# Patient Record
Sex: Female | Born: 1950 | Race: White | Hispanic: No | Smoking: Current every day smoker
Health system: Southern US, Community
[De-identification: ages and names within clinical notes are randomized; demographics above are authoritative.]

## PROBLEM LIST (undated history)

## (undated) DIAGNOSIS — J40 Bronchitis, not specified as acute or chronic: Secondary | ICD-10-CM

## (undated) DIAGNOSIS — J449 Chronic obstructive pulmonary disease, unspecified: Secondary | ICD-10-CM

## (undated) DIAGNOSIS — J439 Emphysema, unspecified: Secondary | ICD-10-CM

## (undated) DIAGNOSIS — E785 Hyperlipidemia, unspecified: Secondary | ICD-10-CM

## (undated) DIAGNOSIS — R609 Edema, unspecified: Secondary | ICD-10-CM

## (undated) DIAGNOSIS — J45909 Unspecified asthma, uncomplicated: Secondary | ICD-10-CM

## (undated) DIAGNOSIS — M1990 Unspecified osteoarthritis, unspecified site: Secondary | ICD-10-CM

## (undated) DIAGNOSIS — E559 Vitamin D deficiency, unspecified: Secondary | ICD-10-CM

## (undated) DIAGNOSIS — R05 Cough: Secondary | ICD-10-CM

## (undated) DIAGNOSIS — R7303 Prediabetes: Secondary | ICD-10-CM

## (undated) DIAGNOSIS — K635 Polyp of colon: Secondary | ICD-10-CM

## (undated) DIAGNOSIS — I10 Essential (primary) hypertension: Secondary | ICD-10-CM

## (undated) DIAGNOSIS — G250 Essential tremor: Secondary | ICD-10-CM

## (undated) DIAGNOSIS — F419 Anxiety disorder, unspecified: Secondary | ICD-10-CM

## (undated) DIAGNOSIS — F32A Depression, unspecified: Secondary | ICD-10-CM

## (undated) DIAGNOSIS — R059 Cough, unspecified: Secondary | ICD-10-CM

## (undated) HISTORY — DX: Cough: R05

## (undated) HISTORY — DX: Anxiety disorder, unspecified: F41.9

## (undated) HISTORY — DX: Unspecified asthma, uncomplicated: J45.909

## (undated) HISTORY — PX: TOTAL ABDOMINAL HYSTERECTOMY: SHX209

## (undated) HISTORY — DX: Chronic obstructive pulmonary disease, unspecified: J44.9

## (undated) HISTORY — DX: Polyp of colon: K63.5

## (undated) HISTORY — PX: EYE SURGERY: SHX253

## (undated) HISTORY — DX: Essential (primary) hypertension: I10

## (undated) HISTORY — DX: Vitamin D deficiency, unspecified: E55.9

## (undated) HISTORY — DX: Unspecified osteoarthritis, unspecified site: M19.90

## (undated) HISTORY — DX: Hyperlipidemia, unspecified: E78.5

## (undated) HISTORY — DX: Bronchitis, not specified as acute or chronic: J40

## (undated) HISTORY — PX: CATARACT EXTRACTION, BILATERAL: SHX1313

## (undated) HISTORY — PX: OVARY SURGERY: SHX727

## (undated) HISTORY — DX: Edema, unspecified: R60.9

## (undated) HISTORY — PX: APPENDECTOMY: SHX54

## (undated) HISTORY — DX: Depression, unspecified: F32.A

## (undated) HISTORY — DX: Emphysema, unspecified: J43.9

## (undated) HISTORY — PX: VAGINAL HYSTERECTOMY: SUR661

## (undated) HISTORY — DX: Cough, unspecified: R05.9

## (undated) HISTORY — DX: Essential tremor: G25.0

---

## 1976-01-05 HISTORY — PX: EXPLORATORY LAPAROTOMY: SUR591

## 2001-10-27 ENCOUNTER — Emergency Department (HOSPITAL_COMMUNITY): Admission: EM | Admit: 2001-10-27 | Discharge: 2001-10-27 | Payer: Self-pay | Admitting: Emergency Medicine

## 2001-11-01 ENCOUNTER — Emergency Department (HOSPITAL_COMMUNITY): Admission: EM | Admit: 2001-11-01 | Discharge: 2001-11-01 | Payer: Self-pay | Admitting: Emergency Medicine

## 2003-05-22 ENCOUNTER — Emergency Department (HOSPITAL_COMMUNITY): Admission: EM | Admit: 2003-05-22 | Discharge: 2003-05-23 | Payer: Self-pay | Admitting: Emergency Medicine

## 2013-01-23 ENCOUNTER — Ambulatory Visit (INDEPENDENT_AMBULATORY_CARE_PROVIDER_SITE_OTHER): Payer: BC Managed Care – PPO | Admitting: Podiatrist

## 2013-01-23 ENCOUNTER — Encounter: Payer: Self-pay | Admitting: Podiatrist

## 2013-01-23 ENCOUNTER — Ambulatory Visit (INDEPENDENT_AMBULATORY_CARE_PROVIDER_SITE_OTHER): Payer: BC Managed Care – PPO

## 2013-01-23 VITALS — BP 147/90 | HR 67 | Resp 18

## 2013-01-23 DIAGNOSIS — M79609 Pain in unspecified limb: Secondary | ICD-10-CM

## 2013-01-23 DIAGNOSIS — M722 Plantar fascial fibromatosis: Secondary | ICD-10-CM

## 2013-01-23 MED ORDER — TRIAMCINOLONE ACETONIDE 40 MG/ML IJ SUSP
20.0000 mg | Freq: Once | INTRAMUSCULAR | Status: AC
  Administered 2013-01-23: 20 mg

## 2013-01-23 MED ORDER — MELOXICAM 15 MG PO TABS: 15.0000 mg | ORAL_TABLET | Freq: Every day | ORAL | Status: DC

## 2013-01-23 NOTE — Progress Notes (Signed)
   Subjective:    Patient ID: Jane Graves, female    DOB: 1950-07-17, 63 y.o.   MRN: [id number]  HPI my left heel hurts on the bottom and been going on for about 2 months and burns and throbs and hurts standing all night at work and sore and tender  Patient presents today for left heel pain. She relates pain with first step after sitting for long periods of time and relates that it improves when she walks around a little bit. She's tried shoe changes and has multiple pairs of shoes that she wears at work.  Review of Systems  Constitutional: Negative.   HENT: Negative.   Eyes: Negative.   Respiratory: Negative.   Cardiovascular: Negative.   Gastrointestinal: Negative.   Endocrine: Negative.   Genitourinary: Negative.   Musculoskeletal: Negative.   Skin: Negative.   Allergic/Immunologic: Negative.   Neurological: Negative.   Hematological: Negative.   Psychiatric/Behavioral: Negative.        Objective:   Physical Exam GENERAL APPEARANCE: Alert, conversant. Appropriately groomed. No acute distress.  VASCULAR: Pedal pulses palpable and strong bilateral.  Capillary refill time is immediate to all digits,  Proximal to distal cooling it warm to warm.  Digital hair growth is present bilateral  NEUROLOGIC: sensation is intact epicritically and protectively to 5.07 monofilament at 5/5 sites bilateral.  Light touch is intact bilateral, vibratory sensation intact bilateral, achilles tendon reflex is intact bilateral.  MUSCULOSKELETAL: pain on palpation plantar medial heel left foot noted.  Rectus foot type is seen.  Discomfort upon standing after sitting for long periods of time noted.  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  She has pain when walking as well.   DERMATOLOGIC: skin color, texture, and turger are within normal limits.  No preulcerative lesions are seen, no interdigital maceration noted.  No open lesions present.  Digital nails are asymptomatic.   xrays are  normal-- no spurring noted, no sign of fracture    Assessment & Plan:  Plantar fasciitis left  Plan: Injection of Kenalog and Marcaine mixture was infiltrated into the left heel under sterile technique. A plantar fascial strapping was applied and she was given instructions for stretching and shoe gear changes. She'll be seen back as needed for followup and if any problems arise she will call.

## 2013-01-23 NOTE — Patient Instructions (Signed)
Wear a good fitting shoe-  Running shoe brands I recommend are Brooks, New Balance and [employer]iics.  Always have a shoe fit specialist help you choose your shoes as there are many "varieties" of shoes and they can find you the best fit.  [employer]/ [employer] ([place], [place], [place]), [employer], [employer] ([place]) have trained staff to help you in this process.  The [employer] in [place] has a great selection of euro-comfort casual shoes with good comfort and support as well.  Avoid prolonged use of thin ballet type flats, or flip flops.  Everyday use of these shoes can actually cause foot problems or injuries.    Plantar Fasciitis (Heel Spur Syndrome) with Rehab The plantar fascia is a fibrous, ligament-like, soft-tissue structure that spans the bottom of the foot. Plantar fasciitis is a condition that causes pain in the foot due to inflammation of the tissue. SYMPTOMS   Pain and tenderness on the underneath side of the foot.  Pain that worsens with standing or walking. CAUSES  Plantar fasciitis is caused by irritation and injury to the plantar fascia on the underneath side of the foot. Common mechanisms of injury include:  Direct trauma to bottom of the foot.  Damage to a small nerve that runs under the foot where the main fascia attaches to the heel bone. Stress placed on the plantar fascia due to any mild increased activity or injury RISK INCREASES WITH:   Obesity.  Poor strength and flexibility.  Improperly fitted shoes.  Tight calf muscles.  Flat feet.  Failure to warm-up properly before activity.  PREVENTION  Warm up and stretch properly before activity.  Strength, flexibility  Maintain a health body weight.  Avoid stress on the plantar fascia.  Wear properly fitted shoes, including arch supports for individuals who have flat feet. PROGNOSIS  If treated properly, then the symptoms of plantar fasciitis usually resolve without  surgery. However, occasionally surgery is necessary. RELATED COMPLICATIONS   Recurrent symptoms that may result in a chronic condition.  Problems of the lower back that are caused by compensating for the injury, such as limping.  Pain or weakness of the foot during push-off following surgery.  Chronic inflammation, scarring, and partial or complete fascia tear, occurring more often from repeated injections. TREATMENT  Treatment initially involves the use of ice and medication to help reduce pain and inflammation. The use of strengthening and stretching exercises may help reduce pain with activity, especially stretches of the Achilles tendon.  Your caregiver may recommend that you use arch supports to help reduce stress on the plantar fascia. Often, corticosteroid injections are given to reduce inflammation. If symptoms persist for greater than 6 months despite non-surgical (conservative), then surgery may be recommended.  MEDICATION   If pain medication is necessary, then nonsteroidal anti-inflammatory medications, such as aspirin and ibuprofen, or other minor pain relievers, such as acetaminophen, are often recommended. Corticosteroid injections may be given by your caregiver.  HEAT AND COLD  Cold treatment (icing) relieves pain and reduces inflammation. Cold treatment should be applied for 10 to 15 minutes every 2 to 3 hours for inflammation and pain and immediately after any activity that aggravates your symptoms. Use ice packs or massage the area with a piece of ice (ice massage).  Heat treatment may be used prior to performing the stretching and strengthening activities prescribed by your caregiver, physical therapist, or athletic trainer. Use a heat pack or soak the injury in warm water. SEEK IMMEDIATE MEDICAL CARE   IF:  Treatment seems to offer no benefit, or the condition worsens.  Any medications produce adverse side effects.    EXERCISES-- perform each exercise a total of 10-15  repetitions.  Hold for 30 seconds and perform 3 times per day   RANGE OF MOTION (ROM) AND STRETCHING EXERCISES - Plantar Fasciitis (Heel Spur Syndrome) These exercises may help you when beginning to rehabilitate your injury.   While completing these exercises, remember:   Restoring tissue flexibility helps normal motion to return to the joints. This allows healthier, less painful movement and activity.  An effective stretch should be held for at least 30 seconds.  A stretch should never be painful. You should only feel a gentle lengthening or release in the stretched tissue. RANGE OF MOTION - Toe Extension, Flexion  Sit with your right / left leg crossed over your opposite knee.  Grasp your toes and gently pull them back toward the top of your foot. You should feel a stretch on the bottom of your toes and/or foot.  Hold this stretch for __________ seconds.  Now, gently pull your toes toward the bottom of your foot. You should feel a stretch on the top of your toes and or foot.  Hold this stretch for __________ seconds. Repeat __________ times. Complete this stretch __________ times per day.  RANGE OF MOTION - Ankle Dorsiflexion, Active Assisted  Remove shoes and sit on a chair that is preferably not on a carpeted surface.  Place right / left foot under knee. Extend your opposite leg for support.  Keeping your heel down, slide your right / left foot back toward the chair until you feel a stretch at your ankle or calf. If you do not feel a stretch, slide your bottom forward to the edge of the chair, while still keeping your heel down.  Hold this stretch for __________ seconds. Repeat __________ times. Complete this stretch __________ times per day.  STRETCH  Gastroc, Standing  Place hands on wall.  Extend right / left leg, keeping the front knee somewhat bent.  Slightly point your toes inward on your back foot.  Keeping your right / left heel on the floor and your knee  straight, shift your weight toward the wall, not allowing your back to arch.  You should feel a gentle stretch in the right / left calf. Hold this position for __________ seconds. Repeat __________ times. Complete this stretch __________ times per day. STRETCH  Soleus, Standing  Place hands on wall.  Extend right / left leg, keeping the other knee somewhat bent.  Slightly point your toes inward on your back foot.  Keep your right / left heel on the floor, bend your back knee, and slightly shift your weight over the back leg so that you feel a gentle stretch deep in your back calf.  Hold this position for __________ seconds. Repeat __________ times. Complete this stretch __________ times per day. STRETCH  Gastrocsoleus, Standing  Note: This exercise can place a lot of stress on your foot and ankle. Please complete this exercise only if specifically instructed by your caregiver.   Place the ball of your right / left foot on a step, keeping your other foot firmly on the same step.  Hold on to the wall or a rail for balance.  Slowly lift your other foot, allowing your body weight to press your heel down over the edge of the step.  You should feel a stretch in your right / left calf.    Hold this position for __________ seconds.  Repeat this exercise with a slight bend in your right / left knee. Repeat __________ times. Complete this stretch __________ times per day.  STRENGTHENING EXERCISES - Plantar Fasciitis (Heel Spur Syndrome)  These exercises may help you when beginning to rehabilitate your injury. They may resolve your symptoms with or without further involvement from your physician, physical therapist or athletic trainer. While completing these exercises, remember:   Muscles can gain both the endurance and the strength needed for everyday activities through controlled exercises.  Complete these exercises as instructed by your physician, physical therapist or athletic trainer.  Progress the resistance and repetitions only as guided.   

## 2013-06-13 LAB — HM COLONOSCOPY

## 2020-10-20 ENCOUNTER — Encounter: Payer: Self-pay | Admitting: Cardiology

## 2020-10-29 ENCOUNTER — Other Ambulatory Visit: Payer: Self-pay

## 2020-10-29 ENCOUNTER — Encounter: Payer: Self-pay | Admitting: Cardiology

## 2020-10-29 ENCOUNTER — Ambulatory Visit: Payer: Medicare Other | Admitting: Cardiology

## 2020-10-29 DIAGNOSIS — R0789 Other chest pain: Secondary | ICD-10-CM | POA: Diagnosis not present

## 2020-10-29 DIAGNOSIS — F17200 Nicotine dependence, unspecified, uncomplicated: Secondary | ICD-10-CM | POA: Diagnosis not present

## 2020-10-29 DIAGNOSIS — I10 Essential (primary) hypertension: Secondary | ICD-10-CM | POA: Insufficient documentation

## 2020-10-29 DIAGNOSIS — R0609 Other forms of dyspnea: Secondary | ICD-10-CM

## 2020-10-29 DIAGNOSIS — E785 Hyperlipidemia, unspecified: Secondary | ICD-10-CM | POA: Diagnosis not present

## 2020-10-29 DIAGNOSIS — Z720 Tobacco use: Secondary | ICD-10-CM

## 2020-10-29 HISTORY — DX: Tobacco use: Z72.0

## 2020-10-29 HISTORY — DX: Other chest pain: R07.89

## 2020-10-29 HISTORY — DX: Other forms of dyspnea: R06.09

## 2020-10-29 MED ORDER — METOPROLOL TARTRATE 100 MG PO TABS: 100.0000 mg | ORAL_TABLET | Freq: Once | ORAL | 0 refills | Status: DC

## 2020-10-29 NOTE — Progress Notes (Signed)
Cardiology Consultation:    Date:  10/29/2020   ID:  Jane Graves, DOB 08-12-1950, MRN [id number]  PCP:  Jerrye Bushy, FNP  Cardiologist:  Gypsy Balsam, MD   Referring MD: Jerrye Bushy, FNP   Chief Complaint  Patient presents with   Chest Pain   night sweats and nausea     Ongoing for about a month    History of Present Illness:    Jane Graves is a 70 y.o. female who is being seen today for the evaluation of atypical chest pain at the request of Jerrye Bushy, FNP.  About month and a half ago at evening time she developed tightness in the lower portion of her chest that tightness gradually migrated towards the front she became nauseated and also sweating profoundly then everything subsided at that time she make a decision that she need to see cardiologist.  Past medical history is also significant for essential hypertension, dyslipidemia, chronic smoking which is still ongoing.  She does have multiple family members having coronary artery disease multiple family members having premature coronary artery disease with bypass surgeries.  She does not exercise on the regular basis she gets short of breath quite easily denies having any typical tightness squeezing pressure burning chest.  Except for the chest sensation that she described initially to me she does not have any more issues.  There is no swelling of lower extremities there is no dizziness no passing out.  She is not on any diet.  She told me straight that she is very scared to be in our office today because she is worried we can find some problems.  20 years ago once her mother passed because of myocardial infarction she was 67 she had a stress test done at that time stress test was normal.  Past Medical History:  Diagnosis Date   COPD (chronic obstructive pulmonary disease) (HCC)    Cough    Essential (primary) hypertension    Essential tremor    Hyperlipidemia    Swelling    Vitamin D deficiency     Past  Surgical History:  Procedure Laterality Date   OVARY SURGERY     removed a cyst   TOTAL ABDOMINAL HYSTERECTOMY      Current Medications: Current Meds  Medication Sig   albuterol (VENTOLIN HFA) 108 (90 Base) MCG/ACT inhaler Inhale 2 puffs into the lungs every 4 (four) hours as needed for wheezing or shortness of breath.   aspirin EC 81 MG tablet Take 81 mg by mouth daily. Swallow whole.   Calcium Carbonate (CALCIUM 500 PO) Take 1,200 mg by mouth daily.   Fluticasone-Umeclidin-Vilant (TRELEGY ELLIPTA) 100-62.5-25 MCG/INH AEPB Inhale 1 puff into the lungs daily.   lisinopril-hydrochlorothiazide (ZESTORETIC) 10-12.5 MG tablet Take 1 tablet by mouth daily.   Multiple Vitamin (MULTIVITAMIN) tablet Take 1 tablet by mouth daily. Unknown strength   naproxen sodium (ALEVE) 220 MG tablet Take 220 mg by mouth every 12 (twelve) hours as needed (pain).   pravastatin (PRAVACHOL) 40 MG tablet Take 40 mg by mouth daily.   vitamin C (ASCORBIC ACID) 500 MG tablet Take 500 mg by mouth daily.     Allergies:   Patient has no known allergies.   Social History   Socioeconomic History   Marital status: Legally Separated    Spouse name: Not on file   Number of children: Not on file   Years of education: Not on file   Highest education level: Not on file  Occupational History   Not on file  Tobacco Use   Smoking status: Every Day    Types: Cigarettes   Smokeless tobacco: Never  Substance and Sexual Activity   Alcohol use: No   Drug use: No   Sexual activity: Not on file  Other Topics Concern   Not on file  Social History Narrative   Not on file   Social Determinants of Health   Financial Resource Strain: Not on file  Food Insecurity: Not on file  Transportation Needs: Not on file  Physical Activity: Not on file  Stress: Not on file  Social Connections: Not on file     Family History: The patient's family history includes Asthma in her brother; Diabetes in her father, mother, and sister;  Emphysema in her mother; Heart disease in her brother, father, mother, and sister; Hyperlipidemia in an other family member; Hypertension in an other family member. ROS:   Please see the history of present illness.    All 14 point review of systems negative except as described per history of present illness.  EKGs/Labs/Other Studies Reviewed:    The following studies were reviewed today:   EKG:  EKG is  ordered today.  The ekg ordered today demonstrates normal sinus rhythm, normal P interval, normal QS complex duration fulgent cannot rule out anterior wall MI.  Recent Labs: No results found for requested labs within last 8760 hours.  Recent Lipid Panel No results found for: CHOL, TRIG, HDL, CHOLHDL, VLDL, LDLCALC, LDLDIRECT  Physical Exam:    VS:  BP 140/80 (BP Location: Right Arm, Patient Position: Sitting)   Pulse 73   Ht 5' (1.524 m)   Wt 141 lb (64 kg)   SpO2 90%   BMI 27.54 kg/m     Wt Readings from Last 3 Encounters:  10/29/20 141 lb (64 kg)  08/22/20 140 lb 3.2 oz (63.6 kg)     GEN:  Well nourished, well developed in no acute distress HEENT: Normal NECK: No JVD; No carotid bruits LYMPHATICS: No lymphadenopathy CARDIAC: RRR, no murmurs, no rubs, no gallops RESPIRATORY:  Clear to auscultation without rales, wheezing or rhonchi  ABDOMEN: Soft, non-tender, non-distended MUSCULOSKELETAL:  No edema; No deformity  SKIN: Warm and dry NEUROLOGIC:  Alert and oriented x 3 PSYCHIATRIC:  Normal affect   ASSESSMENT:    1. Atypical chest pain   2. Dyspnea on exertion   3. Smoking   4. Dyslipidemia   5. Essential hypertension    PLAN:    In order of problems listed above:  Atypical chest pain with multiple risk factors for coronary artery disease.  I will schedule her to have coronary CT angio which in my opinion will be the best test to evaluate both her lungs as well as her coronary arteries.  I did explain procedure to her we will proceed.  She does take aspirin  every single day which I advised her to continue. Dyspnea on exertion which I think is related to chronic smoking and COPD.  However, I will do echocardiogram to assess left ventricle ejection fraction. Smoking sadly still going and she told me straight that it will be extremely difficult for her to quit.  But will work on that. Dyslipidemia she is taking pravastatin which is only moderate intensity statin I asked her to have fasting lipid profile done after call her primary care physician.   Medication Adjustments/Labs and Tests Ordered: Current medicines are reviewed at length with the patient today.  Concerns regarding  medicines are outlined above.  No orders of the defined types were placed in this encounter.  No orders of the defined types were placed in this encounter.   Signed, Georgeanna Lea, MD, [facility]. 10/29/2020 3:01 PM    [facility]

## 2020-10-29 NOTE — Patient Instructions (Addendum)
Medication Instructions:  Your physician recommends that you continue on your current medications as directed. Please refer to the Current Medication list given to you today.  *If you need a refill on your cardiac medications before your next appointment, please call your pharmacy*   Lab Work: Your physician recommends that you return for lab work 3-7 days before ct: BMP  If you have labs (blood work) drawn today and your tests are completely normal, you will receive your results only by: MyChart Message (if you have MyChart) OR A paper copy in the mail If you have any lab test that is abnormal or we need to change your treatment, we will call you to review the results.   Testing/Procedures: Your physician has requested that you have an echocardiogram. Echocardiography is a painless test that uses sound waves to create images of your heart. It provides your doctor with information about the size and shape of your heart and how well your heart's chambers and valves are working. This procedure takes approximately one hour. There are no restrictions for this procedure.    Your cardiac CT will be scheduled at one of the below locations:   [facility] [address] [place], [place] [address] [phone]  OR  [facility] [address] [place], [place] [address] [phone]  If scheduled at [facility], please arrive at the [facility] main entrance (entrance A) of [facility] 30 minutes prior to test start time. You can use the FREE valet parking offered at the main entrance (encouraged to control the heart rate for the test) Proceed to the [facility] Radiology Department (first floor) to check-in and test prep.  If scheduled at [facility], please arrive 15 mins early for check-in and test prep.  Please follow these instructions carefully (unless otherwise directed):    On  the Night Before the Test: Be sure to Drink plenty of water. Do not consume any caffeinated/decaffeinated beverages or chocolate 12 hours prior to your test. Do not take any antihistamines 12 hours prior to your test.   On the Day of the Test: Drink plenty of water until 1 hour prior to the test. Do not eat any food 4 hours prior to the test. You may take your regular medications prior to the test.  Take metoprolol (Lopressor) two hours prior to test. HOLD Hydrochlorothiazide morning of the test. FEMALES- please wear underwire-free bra if available, avoid dresses & tight clothing         After the Test: Drink plenty of water. After receiving IV contrast, you may experience a mild flushed feeling. This is normal. On occasion, you may experience a mild rash up to 24 hours after the test. This is not dangerous. If this occurs, you can take Benadryl 25 mg and increase your fluid intake. If you experience trouble breathing, this can be serious. If it is severe call 911 IMMEDIATELY. If it is mild, please call our office. If you take any of these medications: Glipizide/Metformin, Avandament, Glucavance, please do not take 48 hours after completing test unless otherwise instructed.  Please allow 2-4 weeks for scheduling of routine cardiac CTs. Some insurance companies require a pre-authorization which may delay scheduling of this test.   For non-scheduling related questions, please contact the cardiac imaging nurse navigator should you have any questions/concerns: Jane Graves, Cardiac Imaging Nurse Navigator Jane Graves, Cardiac Imaging Nurse Navigator [facility] Direct  Office Dial: [phone]   For scheduling needs, including cancellations and rescheduling, please call [place], [phone].    Follow-Up: At [facility], you and your health needs are our priority.  As part of our continuing mission to provide you with exceptional heart care, we have  created designated Provider Care Teams.  These Care Teams include your primary Cardiologist (physician) and Advanced Practice Providers (APPs -  Physician Assistants and Nurse Practitioners) who all work together to provide you with the care you need, when you need it.  We recommend signing up for the patient portal called "MyChart".  Sign up information is provided on this After Visit Summary.  MyChart is used to connect with patients for Virtual Visits (Telemedicine).  Patients are able to view lab/test results, encounter notes, upcoming appointments, etc.  Non-urgent messages can be sent to your provider as well.   To learn more about what you can do with MyChart, go to [email or web address].    Your next appointment:   3 month(s)  The format for your next appointment:   In Person  Provider:   Gypsy Balsam, Jane Graves   Other Instructions  Cardiac CT Angiogram A cardiac CT angiogram is a procedure to look at the heart and the area around the heart. It may be done to help find the cause of chest pains or other symptoms of heart disease. During this procedure, a substance called contrast dye is injected into the blood vessels in the area to be checked. A large X-ray machine, called a CT scanner, then takes detailed pictures of the heart and the surrounding area. The procedure is also sometimes called a coronary CT angiogram, coronary artery scanning, or CTA. A cardiac CT angiogram allows the health care provider to see how well blood is flowing to and from the heart. The health care provider will be able to see if there are any problems, such as: Blockage or narrowing of the coronary arteries in the heart. Fluid around the heart. Signs of weakness or disease in the muscles, valves, and tissues of the heart. Tell a health care provider about: Any allergies you have. This is especially important if you have had a previous allergic reaction to contrast dye. All medicines you are taking,  including vitamins, herbs, eye drops, creams, and over-the-counter medicines. Any blood disorders you have. Any surgeries you have had. Any medical conditions you have. Whether you are pregnant or may be pregnant. Any anxiety disorders, chronic pain, or other conditions you have that may increase your stress or prevent you from lying still. What are the risks? Generally, this is a safe procedure. However, problems may occur, including: Bleeding. Infection. Allergic reactions to medicines or dyes. Damage to other structures or organs. Kidney damage from the contrast dye that is used. Increased risk of cancer from radiation exposure. This risk is low. Talk with your health care provider about: The risks and benefits of testing. How you can receive the lowest dose of radiation. What happens before the procedure? Wear comfortable clothing and remove any jewelry, glasses, dentures, and hearing aids. Follow instructions from your health care provider about eating and drinking. This may include: For 12 hours before the procedure -- avoid caffeine. This includes tea, coffee, soda, energy drinks, and diet pills. Drink plenty of water or other fluids that do not have caffeine in them. Being well hydrated can prevent complications. For 4-6 hours before the procedure -- stop eating and drinking. The contrast dye can cause nausea, but this is less  likely if your stomach is empty. Ask your health care provider about changing or stopping your regular medicines. This is especially important if you are taking diabetes medicines, blood thinners, or medicines to treat problems with erections (erectile dysfunction). What happens during the procedure?  Hair on your chest may need to be removed so that small sticky patches called electrodes can be placed on your chest. These will transmit information that helps to monitor your heart during the procedure. An IV will be inserted into one of your veins. You might  be given a medicine to control your heart rate during the procedure. This will help to ensure that good images are obtained. You will be asked to lie on an exam table. This table will slide in and out of the CT machine during the procedure. Contrast dye will be injected into the IV. You might feel warm, or you may get a metallic taste in your mouth. You will be given a medicine called nitroglycerin. This will relax or dilate the arteries in your heart. The table that you are lying on will move into the CT machine tunnel for the scan. The person running the machine will give you instructions while the scans are being done. You may be asked to: Keep your arms above your head. Hold your breath. Stay very still, even if the table is moving. When the scanning is complete, you will be moved out of the machine. The IV will be removed. The procedure may vary among health care providers and hospitals. What can I expect after the procedure? After your procedure, it is common to have: A metallic taste in your mouth from the contrast dye. A feeling of warmth. A headache from the nitroglycerin. Follow these instructions at home: Take over-the-counter and prescription medicines only as told by your health care provider. If you are told, drink enough fluid to keep your urine pale yellow. This will help to flush the contrast dye out of your body. Most people can return to their normal activities right after the procedure. Ask your health care provider what activities are safe for you. It is up to you to get the results of your procedure. Ask your health care provider, or the department that is doing the procedure, when your results will be ready. Keep all follow-up visits as told by your health care provider. This is important. Contact a health care provider if: You have any symptoms of allergy to the contrast dye. These include: Shortness of breath. Rash or hives. A racing heartbeat. Summary A cardiac  CT angiogram is a procedure to look at the heart and the area around the heart. It may be done to help find the cause of chest pains or other symptoms of heart disease. During this procedure, a large X-ray machine, called a CT scanner, takes detailed pictures of the heart and the surrounding area after a contrast dye has been injected into blood vessels in the area. Ask your health care provider about changing or stopping your regular medicines before the procedure. This is especially important if you are taking diabetes medicines, blood thinners, or medicines to treat erectile dysfunction. If you are told, drink enough fluid to keep your urine pale yellow. This will help to flush the contrast dye out of your body. This information is not intended to replace advice given to you by your health care provider. Make sure you discuss any questions you have with your health care provider. Document Revised: 08/16/2018 Document Reviewed: 08/16/2018 Elsevier  Patient Education  2022 [employer].

## 2020-11-03 LAB — BASIC METABOLIC PANEL
BUN/Creatinine Ratio: 22 (ref 12–28)
BUN: 20 mg/dL (ref 8–27)
CO2: 26 mmol/L (ref 20–29)
Calcium: 10.6 mg/dL — ABNORMAL HIGH (ref 8.7–10.3)
Chloride: 101 mmol/L (ref 96–106)
Creatinine, Ser: 0.91 mg/dL (ref 0.57–1.00)
Glucose: 94 mg/dL (ref 70–99)
Potassium: 4 mmol/L (ref 3.5–5.2)
Sodium: 140 mmol/L (ref 134–144)
eGFR: 69 mL/min/{1.73_m2} (ref >59)

## 2020-11-04 ENCOUNTER — Telehealth (HOSPITAL_COMMUNITY): Payer: Self-pay | Admitting: Emergency Medicine

## 2020-11-04 NOTE — Telephone Encounter (Signed)
Reaching out to patient to offer assistance regarding upcoming cardiac imaging study; pt verbalizes understanding of appt date/time, parking situation and where to check in, pre-test NPO status and medications ordered, and verified current allergies; name and call back number provided for further questions should they arise Rockwell Alexandria RN Navigator Cardiac Imaging [facility] [phone] office [phone] cell  Denies IV issues

## 2020-11-06 ENCOUNTER — Encounter (HOSPITAL_COMMUNITY): Payer: Self-pay

## 2020-11-06 ENCOUNTER — Telehealth: Payer: Self-pay | Admitting: Cardiology

## 2020-11-06 ENCOUNTER — Other Ambulatory Visit (HOSPITAL_COMMUNITY): Payer: Self-pay | Admitting: Emergency Medicine

## 2020-11-06 ENCOUNTER — Ambulatory Visit (HOSPITAL_COMMUNITY)
Admission: RE | Admit: 2020-11-06 | Discharge: 2020-11-06 | Disposition: A | Discharge status: Home / Self Care | Payer: Medicare Other | Source: Ambulatory Visit | Attending: Cardiology | Admitting: Cardiology

## 2020-11-06 ENCOUNTER — Other Ambulatory Visit: Payer: Self-pay

## 2020-11-06 DIAGNOSIS — R0789 Other chest pain: Secondary | ICD-10-CM | POA: Insufficient documentation

## 2020-11-06 DIAGNOSIS — I10 Essential (primary) hypertension: Secondary | ICD-10-CM | POA: Insufficient documentation

## 2020-11-06 DIAGNOSIS — E785 Hyperlipidemia, unspecified: Secondary | ICD-10-CM | POA: Diagnosis not present

## 2020-11-06 DIAGNOSIS — I700 Atherosclerosis of aorta: Secondary | ICD-10-CM | POA: Insufficient documentation

## 2020-11-06 DIAGNOSIS — R079 Chest pain, unspecified: Secondary | ICD-10-CM

## 2020-11-06 DIAGNOSIS — F17200 Nicotine dependence, unspecified, uncomplicated: Secondary | ICD-10-CM | POA: Diagnosis present

## 2020-11-06 DIAGNOSIS — R0609 Other forms of dyspnea: Secondary | ICD-10-CM | POA: Diagnosis present

## 2020-11-06 DIAGNOSIS — I2510 Atherosclerotic heart disease of native coronary artery without angina pectoris: Secondary | ICD-10-CM

## 2020-11-06 DIAGNOSIS — R931 Abnormal findings on diagnostic imaging of heart and coronary circulation: Secondary | ICD-10-CM

## 2020-11-06 MED ORDER — IOHEXOL 350 MG/ML SOLN
95.0000 mL | Freq: Once | INTRAVENOUS | Status: AC | PRN
  Administered 2020-11-06: 95 mL via INTRAVENOUS

## 2020-11-06 MED ORDER — NITROGLYCERIN 0.4 MG SL SUBL
SUBLINGUAL_TABLET | SUBLINGUAL | Status: AC
  Filled 2020-11-06: qty 2

## 2020-11-06 MED ORDER — NITROGLYCERIN 0.4 MG SL SUBL
0.8000 mg | SUBLINGUAL_TABLET | Freq: Once | SUBLINGUAL | Status: AC
  Administered 2020-11-06: 0.8 mg via SUBLINGUAL

## 2020-11-06 NOTE — Telephone Encounter (Signed)
° °  Pt is returning call to get lab result °

## 2020-11-06 NOTE — Telephone Encounter (Signed)
Patient informed of results.  

## 2020-11-07 ENCOUNTER — Ambulatory Visit (HOSPITAL_COMMUNITY)
Admission: RE | Admit: 2020-11-07 | Discharge: 2020-11-07 | Disposition: A | Discharge status: Home / Self Care | Payer: Medicare Other | Source: Ambulatory Visit | Attending: Cardiology | Admitting: Cardiology

## 2020-11-07 DIAGNOSIS — R079 Chest pain, unspecified: Secondary | ICD-10-CM | POA: Diagnosis present

## 2020-11-07 DIAGNOSIS — R931 Abnormal findings on diagnostic imaging of heart and coronary circulation: Secondary | ICD-10-CM | POA: Insufficient documentation

## 2020-11-10 DIAGNOSIS — I2510 Atherosclerotic heart disease of native coronary artery without angina pectoris: Secondary | ICD-10-CM | POA: Diagnosis not present

## 2020-11-10 DIAGNOSIS — R931 Abnormal findings on diagnostic imaging of heart and coronary circulation: Secondary | ICD-10-CM

## 2020-11-11 ENCOUNTER — Ambulatory Visit (INDEPENDENT_AMBULATORY_CARE_PROVIDER_SITE_OTHER): Payer: Medicare Other

## 2020-11-11 ENCOUNTER — Other Ambulatory Visit: Payer: Self-pay

## 2020-11-11 DIAGNOSIS — R0789 Other chest pain: Secondary | ICD-10-CM

## 2020-11-11 DIAGNOSIS — R0609 Other forms of dyspnea: Secondary | ICD-10-CM | POA: Diagnosis not present

## 2020-11-11 DIAGNOSIS — F17200 Nicotine dependence, unspecified, uncomplicated: Secondary | ICD-10-CM | POA: Diagnosis not present

## 2020-11-11 DIAGNOSIS — I10 Essential (primary) hypertension: Secondary | ICD-10-CM

## 2020-11-11 DIAGNOSIS — E785 Hyperlipidemia, unspecified: Secondary | ICD-10-CM

## 2020-11-11 LAB — ECHOCARDIOGRAM COMPLETE
Area-P 1/2: 3.63 cm2
S' Lateral: 2.1 cm

## 2020-11-11 NOTE — Telephone Encounter (Signed)
Follow Up:      Patient returning Hayley's call, concerning her results.

## 2020-11-11 NOTE — Telephone Encounter (Signed)
Spoke to patient informed her of results.  

## 2021-01-30 ENCOUNTER — Other Ambulatory Visit: Payer: Self-pay

## 2021-02-02 ENCOUNTER — Encounter: Payer: Self-pay | Admitting: Cardiology

## 2021-02-02 ENCOUNTER — Ambulatory Visit (INDEPENDENT_AMBULATORY_CARE_PROVIDER_SITE_OTHER): Payer: Medicare Other | Admitting: Cardiology

## 2021-02-02 ENCOUNTER — Other Ambulatory Visit: Payer: Self-pay

## 2021-02-02 VITALS — BP 110/76 | HR 70 | Ht 60.0 in | Wt 139.8 lb

## 2021-02-02 DIAGNOSIS — R0609 Other forms of dyspnea: Secondary | ICD-10-CM

## 2021-02-02 DIAGNOSIS — I10 Essential (primary) hypertension: Secondary | ICD-10-CM

## 2021-02-02 DIAGNOSIS — E785 Hyperlipidemia, unspecified: Secondary | ICD-10-CM | POA: Diagnosis not present

## 2021-02-02 DIAGNOSIS — I2510 Atherosclerotic heart disease of native coronary artery without angina pectoris: Secondary | ICD-10-CM

## 2021-02-02 DIAGNOSIS — R0789 Other chest pain: Secondary | ICD-10-CM | POA: Diagnosis not present

## 2021-02-02 DIAGNOSIS — F17200 Nicotine dependence, unspecified, uncomplicated: Secondary | ICD-10-CM

## 2021-02-02 HISTORY — DX: Atherosclerotic heart disease of native coronary artery without angina pectoris: I25.10

## 2021-02-02 NOTE — Progress Notes (Signed)
Cardiology Office Note:    Date:  02/02/2021   ID:  Jane CampusSharon Graves, DOB 1950-10-31, MRN [id number]  PCP:  Jerrye BushyPoe, Chelsea R, FNP  Cardiologist:  Gypsy Balsamobert Emmilynn Marut, MD    Referring MD: Jerrye BushyPoe, Chelsea R, FNP   Chief Complaint  Patient presents with   Results    History of Present Illness:    Jane Graves is a 71 y.o. female who was referred to us because of atypical chest pain.  She does have COPD, smoking, essential hypertension, dyslipidemia.  Echocardiogram has been performed which showed preserved left ventricle ejection fraction without significant pathology, s coronary CT angio however showed moderate disease especially RCA.  Fractional flow reserve has been performed that stenosis hemodynamically insignificant.  She is coming today to my office to discuss results of the test.  Overall she seems to be doing well.  Recently she was suffering from some bronchitis/sinusitis.  Zithromax has been given to her and she is doing better.  She denies have any chest pain tightness squeezing pressure burning chest.  Past Medical History:  Diagnosis Date   Bronchitis    COPD (chronic obstructive pulmonary disease) (HCC)    Cough    Essential (primary) hypertension    Essential tremor    Hyperlipidemia    Swelling    Vitamin D deficiency     Past Surgical History:  Procedure Laterality Date   OVARY SURGERY     removed a cyst   TOTAL ABDOMINAL HYSTERECTOMY      Current Medications: Current Meds  Medication Sig   albuterol (VENTOLIN HFA) 108 (90 Base) MCG/ACT inhaler Inhale 2 puffs into the lungs every 4 (four) hours as needed for wheezing or shortness of breath.   aspirin EC 81 MG tablet Take 81 mg by mouth daily. Swallow whole.   Calcium Carbonate (CALCIUM 500 PO) Take 1,200 mg by mouth daily.   Fluticasone-Umeclidin-Vilant (TRELEGY ELLIPTA) 100-62.5-25 MCG/INH AEPB Inhale 1 puff into the lungs daily.   lisinopril-hydrochlorothiazide (ZESTORETIC) 10-12.5 MG tablet Take 1 tablet by mouth  daily.   Multiple Vitamin (MULTIVITAMIN) tablet Take 1 tablet by mouth daily. Unknown strength   naproxen sodium (ALEVE) 220 MG tablet Take 220 mg by mouth every 12 (twelve) hours as needed (pain).   pravastatin (PRAVACHOL) 40 MG tablet Take 40 mg by mouth daily.   vitamin C (ASCORBIC ACID) 500 MG tablet Take 500 mg by mouth daily.     Allergies:   Patient has no known allergies.   Social History   Socioeconomic History   Marital status: Divorced    Spouse name: Not on file   Number of children: Not on file   Years of education: Not on file   Highest education level: Not on file  Occupational History   Not on file  Tobacco Use   Smoking status: Every Day    Types: Cigarettes   Smokeless tobacco: Never  Substance and Sexual Activity   Alcohol use: No   Drug use: No   Sexual activity: Not on file  Other Topics Concern   Not on file  Social History Narrative   Not on file   Social Determinants of Health   Financial Resource Strain: Not on file  Food Insecurity: Not on file  Transportation Needs: Not on file  Physical Activity: Not on file  Stress: Not on file  Social Connections: Not on file     Family History: The patient's family history includes Asthma in her brother; Diabetes in her father, mother, and  sister; Emphysema in her mother; Heart disease in her brother, father, mother, and sister; Hyperlipidemia in an other family member; Hypertension in an other family member. ROS:   Please see the history of present illness.    All 14 point review of systems negative except as described per history of present illness  EKGs/Labs/Other Studies Reviewed:    Echocardiogram done 11/11/2020 showed: IMPRESSIONS     1. Left ventricular ejection fraction, by estimation, is 60 to 65%. The  left ventricle has normal function. The left ventricle has no regional  wall motion abnormalities. Left ventricular diastolic parameters were  normal. The average left ventricular   global longitudinal strain is -12.4 %. The global longitudinal strain is  abnormal.   2. Right ventricular systolic function is normal. The right ventricular  size is normal. There is normal pulmonary artery systolic pressure.   3. The mitral valve is normal in structure. No evidence of mitral valve  regurgitation. No evidence of mitral stenosis.   4. The aortic valve is tricuspid. Aortic valve regurgitation is not  visualized. No aortic stenosis is present.   5. The inferior vena cava is normal in size with greater than 50%  respiratory variability, suggesting right atrial pressure of 3 mmHg.   Coronary CT angio showed: Coronary Arteries:  Normal coronary origin.  Right dominance.   RCA is a large dominant artery that gives rise to PDA and PLA. Close to the orifice of the RCA there is large calcified plaque in the aorta. There is moderate stenosis of 50-70% of the RCA at its ostium. Proximal and mid portion of the RCA has diffuse disease with mixed, mild, non-obstructive plaques of 25-49%. Misregistration artefacts are noted.   Left main is a large, short artery that gives rise to LAD and LCX arteries. Very small intermediate branch is noted.   LAD is a large vessel that has multiple, mostly calcified mild, non-obstructive (25-49%) plaques in its proximal and mid portion. Large D1 is noted.   LCX is a non-dominant artery that gives rise to one small OM1 and large OM2 branches. Proximal and mid portion of the LCX has multiple, mixed, non-obstructive (25-49% stenosis) plaques.   Other findings:   Normal pulmonary vein drainage into the left atrium.   Normal left atrial appendage without a thrombus.   Normal size of the pulmonary artery.   Small PFO is noted.   IMPRESSION: 1. Coronary calcium score of 534. This was 49 percentile for age and sex matched control.   2. Normal coronary origin with right dominance.   3. CAD-RADS 3 - moderate stenosis.   4. Possibly  hemodynamically significant ostial RCA stenosis, will perform FFR analysis   Georgeanna Lea, MD   Fractional flow reserve showed: 1. Left main: 0.99   2. LAD: Prox 0.98, mid 0.87, distal 0.8   3.  LCX: Prox 0.98, mid 0.97, distal 0.92   4.  RCA: Prox 0.97, mid 0.9, distal 0.88   : Conclusion:   Low probability for hemodynamically significant lesion base of FFR analysis.    Recent Labs: 11/03/2020: BUN 20; Creatinine, Ser 0.91; Potassium 4.0; Sodium 140  Recent Lipid Panel No results found for: CHOL, TRIG, HDL, CHOLHDL, VLDL, LDLCALC, LDLDIRECT  Physical Exam:    VS:  BP 110/76 (BP Location: Left Arm, Patient Position: Sitting)    Pulse 70    Ht 5' (1.524 m)    Wt 139 lb 12.8 oz (63.4 kg)    SpO2 94%  BMI 27.30 kg/m     Wt Readings from Last 3 Encounters:  02/02/21 139 lb 12.8 oz (63.4 kg)  10/29/20 141 lb (64 kg)  08/22/20 140 lb 3.2 oz (63.6 kg)     GEN:  Well nourished, well developed in no acute distress HEENT: Normal NECK: No JVD; No carotid bruits LYMPHATICS: No lymphadenopathy CARDIAC: RRR, no murmurs, no rubs, no gallops RESPIRATORY:  Clear to auscultation without rales, wheezing or rhonchi  ABDOMEN: Soft, non-tender, non-distended MUSCULOSKELETAL:  No edema; No deformity  SKIN: Warm and dry LOWER EXTREMITIES: no swelling NEUROLOGIC:  Alert and oriented x 3 PSYCHIATRIC:  Normal affect   ASSESSMENT:    1. Dyslipidemia   2. Coronary artery disease involving native coronary artery of native heart without angina pectoris   3. Dyspnea on exertion   4. Atypical chest pain   5. Essential hypertension   6. Smoking    PLAN:    In order of problems listed above:  Coronary artery disease.  Coronary CT angio showed moderate disease of RCA 80 kg risk factors modifications.  She is on antiplatelet therapy in form of aspirin which I will continue.  She is also on statin however I suspect we will have to increase intensity of cholesterol-lowering  medications. Dyspnea on exertion echocardiogram showed preserved ejection fraction, smoking obviously is a problem.  She was told again she must quit. Atypical chest pain denies having any, Essential hypertension blood pressure well controlled continue present management.  Medication Adjustments/Labs and Tests Ordered: Current medicines are reviewed at length with the patient today.  Concerns regarding medicines are outlined above.  No orders of the defined types were placed in this encounter.  Medication changes: No orders of the defined types were placed in this encounter.   Signed, Georgeanna Lea, MD, [facility] 02/02/2021 3:47 PM    [facility]

## 2021-02-02 NOTE — Patient Instructions (Signed)
Medication Instructions:  Your physician recommends that you continue on your current medications as directed. Please refer to the Current Medication list given to you today.  *If you need a refill on your cardiac medications before your next appointment, please call your pharmacy*   Lab Work: Your physician recommends that you return for lab work in: Direct LDL today. If you have labs (blood work) drawn today and your tests are completely normal, you will receive your results only by: MyChart Message (if you have MyChart) OR A paper copy in the mail If you have any lab test that is abnormal or we need to change your treatment, we will call you to review the results.   Testing/Procedures: None   Follow-Up: At [facility], you and your health needs are our priority.  As part of our continuing mission to provide you with exceptional heart care, we have created designated Provider Care Teams.  These Care Teams include your primary Cardiologist (physician) and Advanced Practice Providers (APPs -  Physician Assistants and Nurse Practitioners) who all work together to provide you with the care you need, when you need it.  We recommend signing up for the patient portal called "MyChart".  Sign up information is provided on this After Visit Summary.  MyChart is used to connect with patients for Virtual Visits (Telemedicine).  Patients are able to view lab/test results, encounter notes, upcoming appointments, etc.  Non-urgent messages can be sent to your provider as well.   To learn more about what you can do with MyChart, go to [email or web address].    Your next appointment:   6 month(s)  The format for your next appointment:   In Person  Provider:   Robert Krasowski, MD   Other Instructions   

## 2021-02-03 LAB — LDL CHOLESTEROL, DIRECT: LDL Direct: 98 mg/dL (ref 0–99)

## 2021-02-06 ENCOUNTER — Telehealth: Payer: Self-pay | Admitting: Cardiology

## 2021-02-06 DIAGNOSIS — E785 Hyperlipidemia, unspecified: Secondary | ICD-10-CM

## 2021-02-06 MED ORDER — PRAVASTATIN SODIUM 80 MG PO TABS: 80.0000 mg | ORAL_TABLET | Freq: Every evening | ORAL | 3 refills | Status: DC

## 2021-02-06 NOTE — Telephone Encounter (Signed)
Patient returning call to discuss lab results. Please call back 

## 2021-02-06 NOTE — Telephone Encounter (Signed)
Spoke with patient regarding results and recommendation.  Patient verbalizes understanding and is agreeable to plan of care. Advised patient to call back with any issues or concerns.  

## 2021-02-16 ENCOUNTER — Ambulatory Visit: Payer: Medicare Other

## 2021-02-17 ENCOUNTER — Ambulatory Visit: Payer: Medicare Other

## 2021-03-18 ENCOUNTER — Telehealth: Payer: Self-pay | Admitting: Cardiology

## 2021-03-18 NOTE — Telephone Encounter (Signed)
Patient called and wanted to make sure Dr. Bing Matter got a copy of the labs done by her PCP on Monday 03/16/21.  ? ?She was supposed to come to our office for repeat labs ?

## 2021-03-19 NOTE — Telephone Encounter (Signed)
Spoke with the patient, advise we still need her to come by to draw the cholesterol panel on file. Patient understood.  ?

## 2021-03-24 LAB — LIPID PANEL
Chol/HDL Ratio: 3.4 ratio (ref 0.0–4.4)
Cholesterol, Total: 183 mg/dL (ref 100–199)
HDL: 54 mg/dL
LDL Chol Calc (NIH): 102 mg/dL — ABNORMAL HIGH (ref 0–99)
Triglycerides: 157 mg/dL — ABNORMAL HIGH (ref 0–149)
VLDL Cholesterol Cal: 27 mg/dL (ref 5–40)

## 2021-04-02 ENCOUNTER — Telehealth: Payer: Self-pay

## 2021-04-02 DIAGNOSIS — E785 Hyperlipidemia, unspecified: Secondary | ICD-10-CM

## 2021-04-02 MED ORDER — EZETIMIBE 10 MG PO TABS: 10.0000 mg | ORAL_TABLET | Freq: Every day | ORAL | 1 refills | Status: DC

## 2021-04-02 NOTE — Telephone Encounter (Signed)
Patient notified of results and recommendation and agreed with plan. Rx sent. Lab order on file. Patient advised to be fasting for blood work.  ?

## 2021-04-02 NOTE — Telephone Encounter (Signed)
-----   Message from Park Liter, MD sent at 03/25/2021  7:41 PM EDT ----- ?Cholesterol still unacceptably elevated, please add Zetia 10 mg daily to medical regimen, fasting lipid profile, AST LT 6 weeks ?

## 2021-05-12 LAB — LIPID PANEL
Chol/HDL Ratio: 3 ratio (ref 0.0–4.4)
Cholesterol, Total: 150 mg/dL (ref 100–199)
HDL: 50 mg/dL (ref >39)
LDL Chol Calc (NIH): 79 mg/dL (ref 0–99)
Triglycerides: 116 mg/dL (ref 0–149)
VLDL Cholesterol Cal: 21 mg/dL (ref 5–40)

## 2021-05-12 LAB — ALT: ALT: 20 IU/L (ref 0–32)

## 2021-05-12 LAB — AST: AST: 21 IU/L (ref 0–40)

## 2021-05-14 ENCOUNTER — Telehealth: Payer: Self-pay

## 2021-05-14 MED ORDER — EZETIMIBE 10 MG PO TABS: 10.0000 mg | ORAL_TABLET | Freq: Every day | ORAL | 0 refills | Status: DC

## 2021-05-14 NOTE — Telephone Encounter (Signed)
-----   Message from Robert J Krasowski, MD sent at 05/13/2021  1:48 PM EDT ----- ?[place] acceptable, continue present management ?

## 2021-05-14 NOTE — Telephone Encounter (Signed)
Patient notified of results.

## 2021-05-14 NOTE — Telephone Encounter (Signed)
-----   Message from Georgeanna Lea, MD sent at 05/13/2021  1:48 PM EDT ----- ?[place]id acceptable, continue present management ?

## 2021-05-22 ENCOUNTER — Encounter: Payer: Medicare Other | Admitting: Internal Medicine

## 2021-07-20 ENCOUNTER — Other Ambulatory Visit: Payer: Self-pay | Admitting: Cardiology

## 2021-08-10 ENCOUNTER — Ambulatory Visit: Payer: Medicare Other | Admitting: Cardiology

## 2021-08-10 ENCOUNTER — Encounter: Payer: Self-pay | Admitting: Cardiology

## 2021-08-10 VITALS — BP 118/78 | HR 63 | Ht 60.0 in | Wt 142.0 lb

## 2021-08-10 DIAGNOSIS — R0609 Other forms of dyspnea: Secondary | ICD-10-CM

## 2021-08-10 DIAGNOSIS — I2510 Atherosclerotic heart disease of native coronary artery without angina pectoris: Secondary | ICD-10-CM

## 2021-08-10 DIAGNOSIS — I10 Essential (primary) hypertension: Secondary | ICD-10-CM | POA: Diagnosis not present

## 2021-08-10 DIAGNOSIS — F17200 Nicotine dependence, unspecified, uncomplicated: Secondary | ICD-10-CM

## 2021-08-10 DIAGNOSIS — E785 Hyperlipidemia, unspecified: Secondary | ICD-10-CM | POA: Diagnosis not present

## 2021-08-10 NOTE — Patient Instructions (Signed)

## 2021-08-10 NOTE — Progress Notes (Signed)
Cardiology Office Note:    Date:  08/10/2021   ID:  Jane Graves, DOB [birth year], MRN [id number]  PCP:  Jerrye Bushy, FNP  Cardiologist:  Gypsy Balsam, MD    Referring MD: Jerrye Bushy, FNP   Chief Complaint  Patient presents with   Follow-up  Doing well  History of Present Illness:    Jane Graves is a 71 y.o. female with past medical history significant for atypical chest pain, COPD smoking which is still ongoing, essential hypertension, dyslipidemia.  She did have coronary CT angio which showed moderate disease however no critical as proven by fractional flow reserve analysis.  Echocardiogram was performed also showed left ventricle ejection fraction being normal. She comes today to my office for follow-up overall doing very well.  She denies have any chest pain tightness squeezing pressure burning chest no palpitation dizziness swelling of lower extremities.  She is working on quitting smoking.  She smokes only half pack per day.  And I encouraged her to quit completely.  Past Medical History:  Diagnosis Date   Bronchitis    COPD (chronic obstructive pulmonary disease) (HCC)    Cough    Essential (primary) hypertension    Essential tremor    Hyperlipidemia    Swelling    Vitamin D deficiency     Past Surgical History:  Procedure Laterality Date   OVARY SURGERY     removed a cyst/ tubal removed from r side   VAGINAL HYSTERECTOMY      Current Medications: Current Meds  Medication Sig   albuterol (VENTOLIN HFA) 108 (90 Base) MCG/ACT inhaler Inhale 2 puffs into the lungs every 4 (four) hours as needed for wheezing or shortness of breath.   aspirin EC 81 MG tablet Take 81 mg by mouth daily. Swallow whole.   Calcium Carbonate (CALCIUM 500 PO) Take 1,200 mg by mouth daily.   ezetimibe (ZETIA) 10 MG tablet Take 1 tablet (10 mg total) by mouth daily.   Fluticasone-Umeclidin-Vilant (TRELEGY ELLIPTA) 100-62.5-25 MCG/INH AEPB Inhale 1 puff into the lungs daily.    lisinopril-hydrochlorothiazide (ZESTORETIC) 10-12.5 MG tablet Take 1 tablet by mouth daily.   Multiple Vitamin (MULTIVITAMIN) tablet Take 1 tablet by mouth daily. Unknown strength   naproxen sodium (ALEVE) 220 MG tablet Take 220 mg by mouth every 12 (twelve) hours as needed (pain).   pravastatin (PRAVACHOL) 80 MG tablet Take 1 tablet (80 mg total) by mouth every evening.   vitamin C (ASCORBIC ACID) 500 MG tablet Take 500 mg by mouth daily.     Allergies:   Patient has no known allergies.   Social History   Socioeconomic History   Marital status: Divorced    Spouse name: Not on file   Number of children: Not on file   Years of education: Not on file   Highest education level: Not on file  Occupational History   Not on file  Tobacco Use   Smoking status: Every Day    Types: Cigarettes   Smokeless tobacco: Never  Substance and Sexual Activity   Alcohol use: No   Drug use: No   Sexual activity: Not on file  Other Topics Concern   Not on file  Social History Narrative   Not on file   Social Determinants of Health   Financial Resource Strain: Not on file  Food Insecurity: Not on file  Transportation Needs: Not on file  Physical Activity: Not on file  Stress: Not on file  Social Connections: Not on file  Family History: The patient's family history includes Asthma in her brother; Diabetes in her father, mother, and sister; Emphysema in her mother; Heart disease in her brother, father, mother, and sister; Hyperlipidemia in an other family member; Hypertension in an other family member. ROS:   Please see the history of present illness.    All 14 point review of systems negative except as described per history of present illness  EKGs/Labs/Other Studies Reviewed:      Recent Labs: 11/03/2020: BUN 20; Creatinine, Ser 0.91; Potassium 4.0; Sodium 140 05/11/2021: ALT 20  Recent Lipid Panel    Component Value Date/Time   CHOL 150 05/11/2021 0926   TRIG 116 05/11/2021 0926    HDL 50 05/11/2021 0926   CHOLHDL 3.0 05/11/2021 0926   LDLCALC 79 05/11/2021 0926   LDLDIRECT 98 02/02/2021 1601    Physical Exam:    VS:  BP 118/78 (BP Location: Left Arm, Patient Position: Sitting)   Pulse 63   Ht 5' (1.524 m)   Wt 142 lb (64.4 kg)   SpO2 92%   BMI 27.73 kg/m     Wt Readings from Last 3 Encounters:  08/10/21 142 lb (64.4 kg)  02/02/21 139 lb 12.8 oz (63.4 kg)  10/29/20 141 lb (64 kg)     GEN:  Well nourished, well developed in no acute distress HEENT: Normal NECK: No JVD; No carotid bruits LYMPHATICS: No lymphadenopathy CARDIAC: RRR, no murmurs, no rubs, no gallops RESPIRATORY:  Clear to auscultation without rales, wheezing or rhonchi  ABDOMEN: Soft, non-tender, non-distended MUSCULOSKELETAL:  No edema; No deformity  SKIN: Warm and dry LOWER EXTREMITIES: no swelling NEUROLOGIC:  Alert and oriented x 3 PSYCHIATRIC:  Normal affect   ASSESSMENT:    1. Coronary artery disease involving native coronary artery of native heart without angina pectoris   2. Essential hypertension   3. Dyspnea on exertion   4. Dyslipidemia   5. Smoking    PLAN:    In order of problems listed above:  Coronary artery disease doing well from that point review asymptomatic decrease risk factors modifications, she is already on antiplatelet therapy in form of aspirin which I will continue Essential hypertension blood pressure well controlled continue present management. Dyspnea on exertion echocardiogram normal coronary CT angio and hemodynamically insignificant lesion I think the reason for her shortness of breath is smoking which she still continues Smoking she cut down cigarettes to only half pack per day we had a long discussion about it and I strongly advised to completely discontinue she understand this and she will try to work on diet. Dyslipidemia I did review her K PN she is on maximally tolerated medications she is on pravastatin 80 and Zetia LDL 79 HDL 50 we will  continue present management but the key will be to quit smoking.   Medication Adjustments/Labs and Tests Ordered: Current medicines are reviewed at length with the patient today.  Concerns regarding medicines are outlined above.  No orders of the defined types were placed in this encounter.  Medication changes: No orders of the defined types were placed in this encounter.   Signed, Georgeanna Lea, MD, [facility] 08/10/2021 9:17 AM    [facility]

## 2021-09-10 ENCOUNTER — Telehealth: Payer: Self-pay

## 2021-09-10 NOTE — Telephone Encounter (Signed)
   Pre-operative Risk Assessment    Patient Name: Jane Graves  DOB: Apr 05, 1950 MRN: [id number]      Request for Surgical Clearance    Procedure:   Cataract Extraction By PE, IOL- Left  Date of Surgery:  Clearance 09/18/21                                 Surgeon:  Dr. Cristal Deer T. Shawnie Dapper Group or Practice Name:  [facility] Phone number:  [phone]  [phone] Fax number:  [phone]   Type of Clearance Requested:   - Medical    Type of Anesthesia:   IV Sedation   Additional requests/questions:   Will need to hold ASA for cataract surgery  Signed, Viviano Simas   09/10/2021, 7:17 AM

## 2021-09-10 NOTE — Telephone Encounter (Signed)
   Patient Name: Jane Graves  DOB: 12/02/1950 MRN: [id number]  Primary Cardiologist: None  Chart reviewed as part of pre-operative protocol coverage. Cataract extractions are recognized in guidelines as low risk surgeries that do not typically require specific preoperative testing or holding of blood thinner therapy. Therefore, given past medical history and time since last visit, based on ACC/AHA guidelines, [name] would be at acceptable risk for the planned procedure without further cardiovascular testing.   I will route this recommendation to the requesting party via Epic fax function and remove from pre-op pool.  Please call with questions.  Sharlene Dory, PA-C 09/10/2021, 8:39 AM

## 2021-09-28 ENCOUNTER — Other Ambulatory Visit: Payer: Self-pay | Admitting: Cardiology

## 2021-12-18 ENCOUNTER — Other Ambulatory Visit: Payer: Self-pay | Admitting: Cardiology

## 2022-08-16 ENCOUNTER — Other Ambulatory Visit: Payer: Self-pay | Admitting: Cardiology

## 2022-08-22 IMAGING — CT CT HEART MORP W/ CTA COR W/ SCORE W/ CA W/CM &/OR W/O CM
4 of 7 series · 8 of 20 positions shown, 9 images · IV contrast (APPLIED)
Comparison: None.
COMPARISON: None.

Addendum:
EXAM:
OVER-READ INTERPRETATION  CT CHEST

The following report is an over-read performed by radiologist Dr.
Benrabah Etoil [REDACTED] on 11/06/2020. This
over-read does not include interpretation of cardiac or coronary
anatomy or pathology. The coronary calcium score/coronary CTA
interpretation by the cardiologist is attached.
CLINICAL DATA: CP
Cardiac/Coronary  CTA
TECHNIQUE: The patient was scanned on a [employer] Force scanner.

[Series 6: best diast · axial · 0.39mm/px · z∈[-317,-277]mm · 2 of 298 slices shown, 3 images]
[im 100/298  vessel]
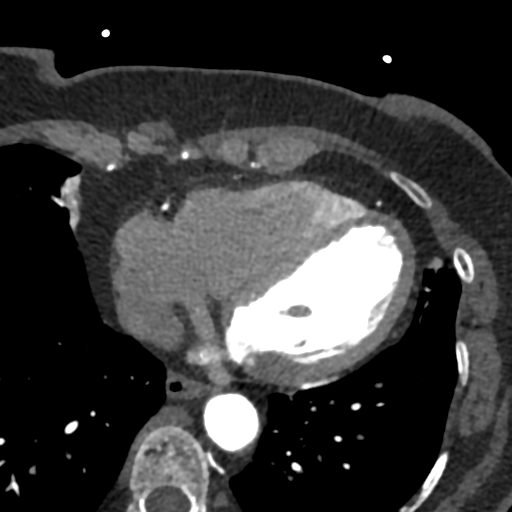
[im 100/298  lung]
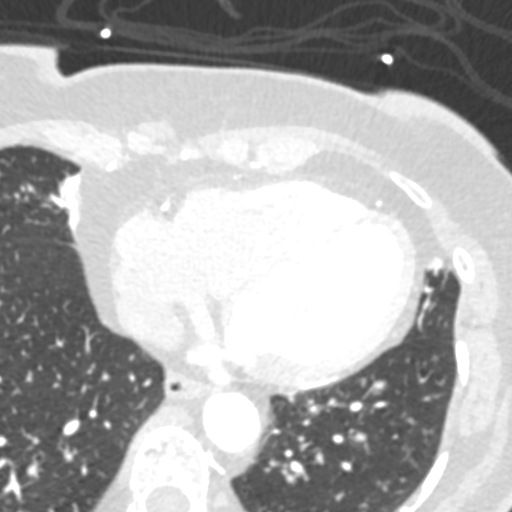
[im 199/298  vessel]
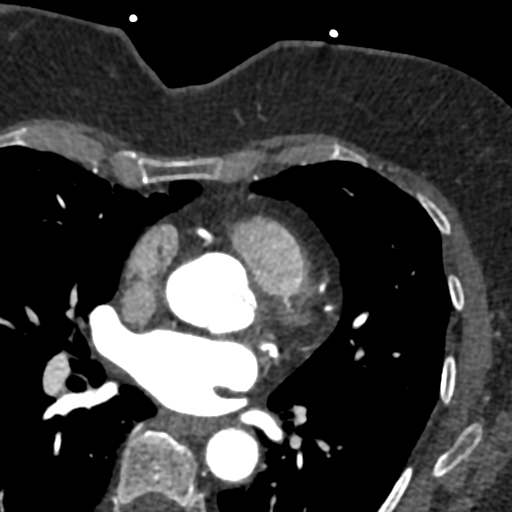

[Series 7: best syst · axial · 0.39mm/px · z∈[-317,-277]mm · 2 of 298 slices shown]
[im 100/298  vessel]
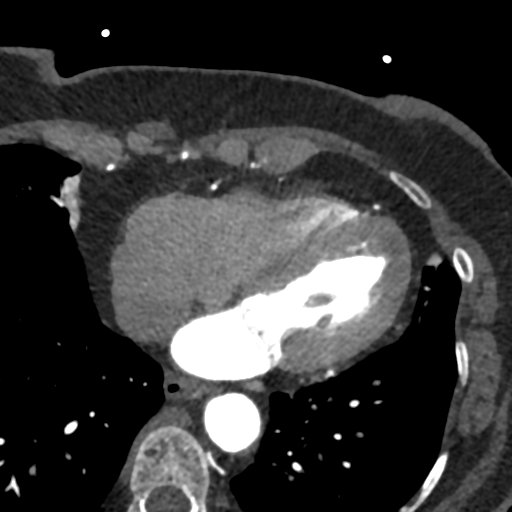
[im 199/298  vessel]
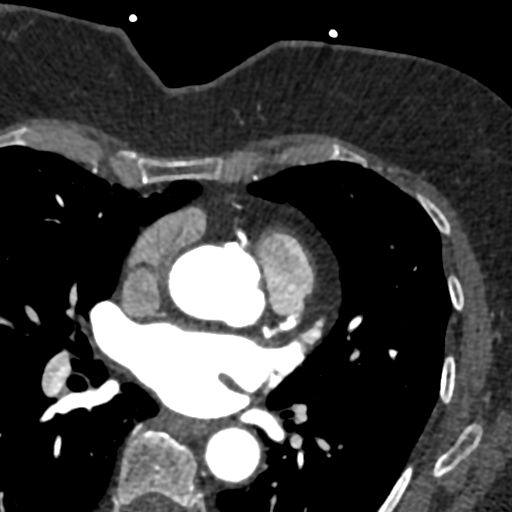

[Series 8: ts diast sharp · axial · 0.39mm/px · z∈[-317,-277]mm · 2 of 298 slices shown]
[im 100/298  lung]
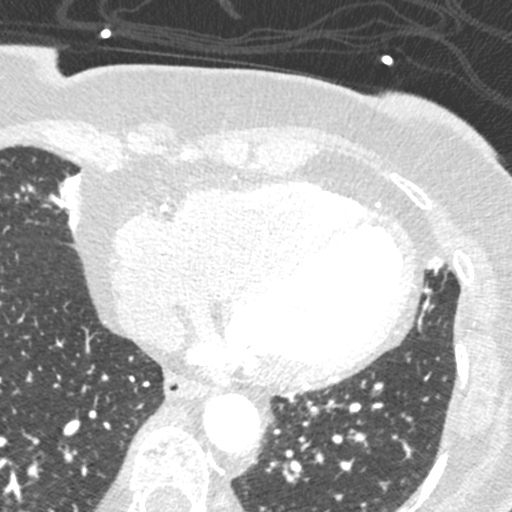
[im 199/298  lung]
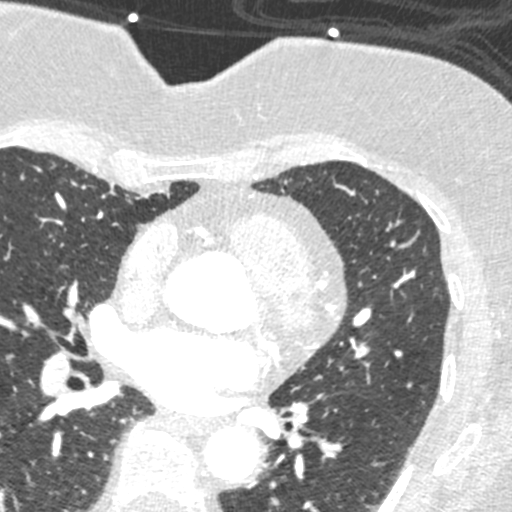

[Series 9: ts syst sharp · axial · 0.39mm/px · z∈[-317,-277]mm · 2 of 298 slices shown]
[im 100/298  lung]
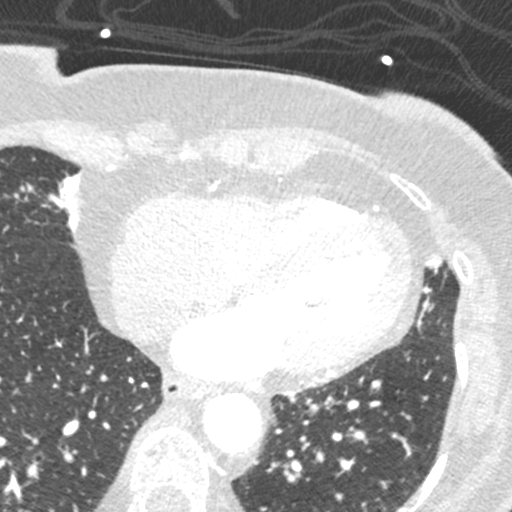
[im 199/298  lung]
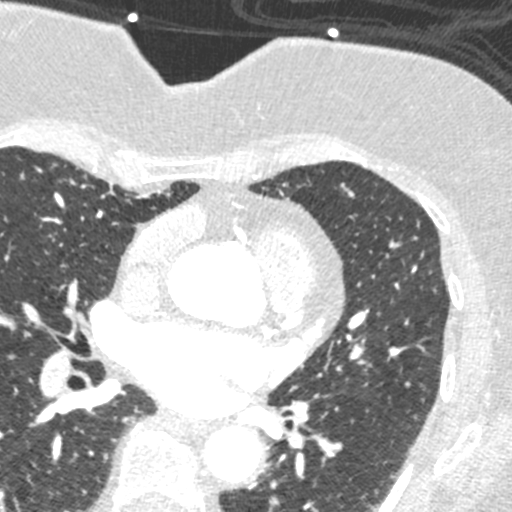

[8 of 20 positions shown; findings below may reference images not displayed]

FINDINGS: Atherosclerotic calcifications in the thoracic aorta. Within the
visualized portions of the thorax there are no suspicious appearing
pulmonary nodules or masses, there is no acute consolidative
airspace disease, no pleural effusions, no pneumothorax and no
lymphadenopathy. Visualized portions of the upper abdomen are
unremarkable. There are no aggressive appearing lytic or blastic
lesions noted in the visualized portions of the skeleton.
IMPRESSION: 1.  Aortic Atherosclerosis (GVN2Q-4NU.U).
FINDINGS: A 120 kV prospective scan was triggered in the descending thoracic
aorta at 111 HU's. Axial non-contrast 3 mm slices were carried out
through the heart. The data set was analyzed on a dedicated work
station and scored using the Agatson method. Gantry rotation speed
was 250 msecs and collimation was .6 mm. No beta blockade and 0.8 mg
of sl NTG was given. The 3D data set was reconstructed in 5%
intervals of the 67-82 % of the R-R cycle. Diastolic phases were
analyzed on a dedicated work station using MPR, MIP and VRT modes.
The patient received 80 cc of contrast.

Aorta:  Normal size.  Mild calcifications.  No dissection.

Aortic Valve:  Trileaflet.  No calcifications.

Coronary Arteries:  Normal coronary origin.  Right dominance.

RCA is a large dominant artery that gives rise to PDA and PLA. Close
to the orifice of the RCA there is large calcified plaque in the
aorta. There is moderate stenosis of 50-70% of the RCA at its
ostium. Proximal and mid portion of the RCA has diffuse disease with
mixed, mild, non-obstructive plaques of 25-49%. Misregistration
artefacts are noted.

Left main is a large, short artery that gives rise to LAD and LCX
arteries. Very small intermediate branch is noted.

LAD is a large vessel that has multiple, mostly calcified mild,
non-obstructive (25-49%) plaques in its proximal and mid portion.
Large D1 is noted.

LCX is a non-dominant artery that gives rise to one small OM1 and
large OM2 branches. Proximal and mid portion of the LCX has
multiple, mixed, non-obstructive (25-49% stenosis) plaques.

Other findings:

Normal pulmonary vein drainage into the left atrium.

Normal left atrial appendage without a thrombus.

Normal size of the pulmonary artery.

Small PFO is noted.
IMPRESSION: 1. Coronary calcium score of 534. This was 94 percentile for age and
sex matched control.

2. Normal coronary origin with right dominance.

3. CAD-RADS 3 - moderate stenosis.

4. Possibly hemodynamically significant ostial RCA stenosis, will
perform FFR analysis

*** End of Addendum ***
EXAM:
OVER-READ INTERPRETATION  CT CHEST

The following report is an over-read performed by radiologist Dr.
Benrabah Etoil [REDACTED] on 11/06/2020. This
over-read does not include interpretation of cardiac or coronary
anatomy or pathology. The coronary calcium score/coronary CTA
interpretation by the cardiologist is attached.
FINDINGS: Atherosclerotic calcifications in the thoracic aorta. Within the
visualized portions of the thorax there are no suspicious appearing
pulmonary nodules or masses, there is no acute consolidative
airspace disease, no pleural effusions, no pneumothorax and no
lymphadenopathy. Visualized portions of the upper abdomen are
unremarkable. There are no aggressive appearing lytic or blastic
lesions noted in the visualized portions of the skeleton.
IMPRESSION: 1.  Aortic Atherosclerosis (GVN2Q-4NU.U).

## 2022-08-25 ENCOUNTER — Ambulatory Visit: Payer: Medicare Other | Attending: Cardiology | Admitting: Cardiology

## 2022-08-25 ENCOUNTER — Ambulatory Visit: Payer: Medicare Other

## 2022-08-25 ENCOUNTER — Encounter: Payer: Self-pay | Admitting: Cardiology

## 2022-08-25 VITALS — BP 120/74 | HR 63 | Ht 60.0 in | Wt 136.4 lb

## 2022-08-25 DIAGNOSIS — E785 Hyperlipidemia, unspecified: Secondary | ICD-10-CM

## 2022-08-25 DIAGNOSIS — I2510 Atherosclerotic heart disease of native coronary artery without angina pectoris: Secondary | ICD-10-CM

## 2022-08-25 DIAGNOSIS — R0609 Other forms of dyspnea: Secondary | ICD-10-CM

## 2022-08-25 DIAGNOSIS — R55 Syncope and collapse: Secondary | ICD-10-CM | POA: Diagnosis not present

## 2022-08-25 DIAGNOSIS — I10 Essential (primary) hypertension: Secondary | ICD-10-CM

## 2022-08-25 HISTORY — DX: Syncope and collapse: R55

## 2022-08-25 NOTE — Addendum Note (Signed)
Addended by: Roosvelt Harps R on: 08/25/2022 01:59 PM   Modules accepted: Orders

## 2022-08-25 NOTE — Progress Notes (Signed)
Cardiology Office Note:    Date:  08/25/2022   ID:  Jane Graves, DOB [birth year], MRN [id number]  PCP:  Jerrye Bushy, FNP  Cardiologist:  Gypsy Balsam, MD    Referring MD: Jerrye Bushy, FNP   Chief Complaint  Patient presents with   Loss of Consciousness    History of Present Illness:    Jane Graves is a 72 y.o. female past medical history significant for atypical chest pain, COPD smoking which is still ongoing, essential hypertension, dyslipidemia.  2 years ago she had coronary CT angio done which showed moderate disease however no critical lesion proven by fractional flow reserve, echocardiogram done at that time showed normal left ventricle ejection fraction.  She is doing well and asymptomatic denies have any chest pain tightness squeezing pressure burning chest but few weeks ago she was coming back from [place] with her sister she was a passenger in the car to stop her sister went to the restroom patient went outside just to walk a little bit when she was walking she started feeling nauseated swimmy headed sweating went back to the car seat in the sit in the car and almost completely passed out she was still able to hear was going on around when her sister came back but she was unable to respond to her.  Gradually she regained full consciousness and then all day she felt very weak tired and exhausted.  She did not have any chest pain no palpitation did not wet herself.  She never had anything like this before.  Obviously she is very scared of it  Past Medical History:  Diagnosis Date   Bronchitis    COPD (chronic obstructive pulmonary disease) (HCC)    Cough    Essential (primary) hypertension    Essential tremor    Hyperlipidemia    Swelling    Vitamin D deficiency     Past Surgical History:  Procedure Laterality Date   OVARY SURGERY     removed a cyst/ tubal removed from r side   VAGINAL HYSTERECTOMY      Current Medications: Current Meds  Medication Sig    albuterol (VENTOLIN HFA) 108 (90 Base) MCG/ACT inhaler Inhale 2 puffs into the lungs every 4 (four) hours as needed for wheezing or shortness of breath.   aspirin EC 81 MG tablet Take 81 mg by mouth daily. Swallow whole.   Calcium Carbonate (CALCIUM 500 PO) Take 600 mg by mouth daily.   ezetimibe (ZETIA) 10 MG tablet Take 1 tablet (10 mg total) by mouth daily. Patient must keep appointment for 08/25/22 for further refills. 1 st attempt   Fluticasone-Umeclidin-Vilant (TRELEGY ELLIPTA) 100-62.5-25 MCG/INH AEPB Inhale 1 puff into the lungs daily.   lisinopril-hydrochlorothiazide (ZESTORETIC) 10-12.5 MG tablet Take 1 tablet by mouth daily.   Multiple Vitamin (MULTIVITAMIN) tablet Take 1 tablet by mouth daily. Unknown strength   naproxen sodium (ALEVE) 220 MG tablet Take 220 mg by mouth every 12 (twelve) hours as needed (pain).   vitamin C (ASCORBIC ACID) 500 MG tablet Take 500 mg by mouth daily.     Allergies:   Propranolol   Social History   Socioeconomic History   Marital status: Divorced    Spouse name: Not on file   Number of children: Not on file   Years of education: Not on file   Highest education level: Not on file  Occupational History   Not on file  Tobacco Use   Smoking status: Every Day  Types: Cigarettes   Smokeless tobacco: Never  Substance and Sexual Activity   Alcohol use: No   Drug use: No   Sexual activity: Not on file  Other Topics Concern   Not on file  Social History Narrative   Not on file   Social Determinants of Health   Financial Resource Strain: Not on file  Food Insecurity: Not on file  Transportation Needs: Not on file  Physical Activity: Not on file  Stress: Not on file  Social Connections: Not on file     Family History: The patient's family history includes Asthma in her brother; Diabetes in her father, mother, and sister; Emphysema in her mother; Heart disease in her brother, father, mother, and sister; Hyperlipidemia in an other family  member; Hypertension in an other family member. ROS:   Please see the history of present illness.    All 14 point review of systems negative except as described per history of present illness  EKGs/Labs/Other Studies Reviewed:         Recent Labs: No results found for requested labs within last 365 days.  Recent Lipid Panel    Component Value Date/Time   CHOL 150 05/11/2021 0926   TRIG 116 05/11/2021 0926   HDL 50 05/11/2021 0926   CHOLHDL 3.0 05/11/2021 0926   LDLCALC 79 05/11/2021 0926   LDLDIRECT 98 02/02/2021 1601    Physical Exam:    VS:  BP 120/74 (BP Location: Left Arm, Patient Position: Sitting)   Pulse 63   Ht 5' (1.524 m)   Wt 136 lb 6.4 oz (61.9 kg)   SpO2 95%   BMI 26.64 kg/m     Wt Readings from Last 3 Encounters:  08/25/22 136 lb 6.4 oz (61.9 kg)  08/10/21 142 lb (64.4 kg)  02/02/21 139 lb 12.8 oz (63.4 kg)     GEN:  Well nourished, well developed in no acute distress HEENT: Normal NECK: No JVD; No carotid bruits LYMPHATICS: No lymphadenopathy CARDIAC: RRR, no murmurs, no rubs, no gallops RESPIRATORY:  Clear to auscultation without rales, wheezing or rhonchi  ABDOMEN: Soft, non-tender, non-distended MUSCULOSKELETAL:  No edema; No deformity  SKIN: Warm and dry LOWER EXTREMITIES: no swelling NEUROLOGIC:  Alert and oriented x 3 PSYCHIATRIC:  Normal affect   ASSESSMENT:    1. Coronary artery disease involving native coronary artery of native heart without angina pectoris   2. Near syncope   3. Essential hypertension   4. Dyspnea on exertion   5. Dyslipidemia    PLAN:    In order of problems listed above:  Coronary artery disease stable from that point review denies have any chest pain tightness squeezing pressure burning chest, continue antiplatelets therapy. Near syncope I suspect vasovagal she was driving a long time she did not drink during the time the fact that they stop and his sister need to go to the restroom she did not make me to  think that she was probably somewhat dehydrated but I need to rule out any arrhythmia, therefore, we will schedule her to have Zio patch for 2 weeks, echocardiogram will be done to assess left ventricle ejection fraction. Essential hypertension blood pressure well-controlled we will continue present management. Dyslipidemia I did review K PN from last year data from May showed HDL of 58 LDL 79.  Will repeat the test   Medication Adjustments/Labs and Tests Ordered: Current medicines are reviewed at length with the patient today.  Concerns regarding medicines are outlined above.  Orders Placed  This Encounter  Procedures   EKG 12-Lead   Medication changes: No orders of the defined types were placed in this encounter.   Signed, Georgeanna Lea, MD, [facility] 08/25/2022 1:50 PM    [facility]

## 2022-08-25 NOTE — Patient Instructions (Signed)
Medication Instructions:  Your physician recommends that you continue on your current medications as directed. Please refer to the Current Medication list given to you today.  *If you need a refill on your cardiac medications before your next appointment, please call your pharmacy*   Lab Work: Your physician recommends that you return for lab work in: Today for Fasting Lipid Panel, AST and ALT  If you have labs (blood work) drawn today and your tests are completely normal, you will receive your results only by: MyChart Message (if you have MyChart) OR A paper copy in the mail If you have any lab test that is abnormal or we need to change your treatment, we will call you to review the results.   Testing/Procedures: Your physician has requested that you have an echocardiogram. Echocardiography is a painless test that uses sound waves to create images of your heart. It provides your doctor with information about the size and shape of your heart and how well your heart's chambers and valves are working. This procedure takes approximately one hour. There are no restrictions for this procedure. Please do NOT wear cologne, perfume, aftershave, or lotions (deodorant is allowed). Please arrive 15 minutes prior to your appointment time.   You have been asked to wear a Zio Heart Monitor today. It is to be worn for 14 days. Please remove the monitor on Sept. 4th  and mail back in the box provided.  If you have any questions about the monitor please call the company at [phone]     Follow-Up: At [facility], you and your health needs are our priority.  As part of our continuing mission to provide you with exceptional heart care, we have created designated Provider Care Teams.  These Care Teams include your primary Cardiologist (physician) and Advanced Practice Providers (APPs -  Physician Assistants and Nurse Practitioners) who all work together to provide you with the care you need, when  you need it.  We recommend signing up for the patient portal called "MyChart".  Sign up information is provided on this After Visit Summary.  MyChart is used to connect with patients for Virtual Visits (Telemedicine).  Patients are able to view lab/test results, encounter notes, upcoming appointments, etc.  Non-urgent messages can be sent to your provider as well.   To learn more about what you can do with MyChart, go to [email or web address].    Your next appointment:   6 month(s)  Provider:   Gypsy Balsam, MD    Other Instructions

## 2022-08-26 LAB — AST: AST: 18 IU/L (ref 0–40)

## 2022-08-26 LAB — LIPID PANEL
Chol/HDL Ratio: 3.1 ratio (ref 0.0–4.4)
Cholesterol, Total: 137 mg/dL (ref 100–199)
HDL: 44 mg/dL (ref >39)
LDL Chol Calc (NIH): 63 mg/dL (ref 0–99)
Triglycerides: 183 mg/dL — ABNORMAL HIGH (ref 0–149)
VLDL Cholesterol Cal: 30 mg/dL (ref 5–40)

## 2022-08-26 LAB — ALT: ALT: 22 IU/L (ref 0–32)

## 2022-09-04 ENCOUNTER — Other Ambulatory Visit: Payer: Self-pay | Admitting: Cardiology

## 2022-09-07 NOTE — Telephone Encounter (Signed)
Rx refill sent to pharmacy. 

## 2022-09-29 ENCOUNTER — Ambulatory Visit: Payer: Medicare Other | Attending: Cardiology

## 2022-09-29 DIAGNOSIS — I10 Essential (primary) hypertension: Secondary | ICD-10-CM

## 2022-09-29 DIAGNOSIS — R0609 Other forms of dyspnea: Secondary | ICD-10-CM

## 2022-09-29 DIAGNOSIS — E785 Hyperlipidemia, unspecified: Secondary | ICD-10-CM

## 2022-09-29 DIAGNOSIS — I2510 Atherosclerotic heart disease of native coronary artery without angina pectoris: Secondary | ICD-10-CM | POA: Diagnosis not present

## 2022-09-29 DIAGNOSIS — R55 Syncope and collapse: Secondary | ICD-10-CM

## 2022-09-29 LAB — ECHOCARDIOGRAM COMPLETE
Area-P 1/2: 3.34 cm2
MV M vel: 5.49 m/s
MV Peak grad: 120.6 mmHg
S' Lateral: 2.5 cm

## 2023-02-25 ENCOUNTER — Ambulatory Visit: Payer: Medicare Other | Attending: Cardiology | Admitting: Cardiology

## 2023-02-25 VITALS — BP 112/66 | HR 72 | Ht 60.0 in | Wt 141.6 lb

## 2023-02-25 DIAGNOSIS — E785 Hyperlipidemia, unspecified: Secondary | ICD-10-CM | POA: Diagnosis not present

## 2023-02-25 DIAGNOSIS — I2510 Atherosclerotic heart disease of native coronary artery without angina pectoris: Secondary | ICD-10-CM | POA: Diagnosis not present

## 2023-02-25 DIAGNOSIS — F17200 Nicotine dependence, unspecified, uncomplicated: Secondary | ICD-10-CM | POA: Diagnosis not present

## 2023-02-25 DIAGNOSIS — I10 Essential (primary) hypertension: Secondary | ICD-10-CM | POA: Diagnosis not present

## 2023-02-25 MED ORDER — VARENICLINE TARTRATE (STARTER) 0.5 MG X 11 & 1 MG X 42 PO TBPK: 1.0000 | ORAL_TABLET | ORAL | 0 refills | Status: DC

## 2023-02-25 NOTE — Patient Instructions (Addendum)
 Medication Instructions:   START: Chantix- as directed    Lab Work: None Ordered If you have labs (blood work) drawn today and your tests are completely normal, you will receive your results only by: MyChart Message (if you have MyChart) OR A paper copy in the mail If you have any lab test that is abnormal or we need to change your treatment, we will call you to review the results.   Testing/Procedures: None Ordered   Follow-Up: At [facility], you and your health needs are our priority.  As part of our continuing mission to provide you with exceptional heart care, we have created designated Provider Care Teams.  These Care Teams include your primary Cardiologist (physician) and Advanced Practice Providers (APPs -  Physician Assistants and Nurse Practitioners) who all work together to provide you with the care you need, when you need it.  We recommend signing up for the patient portal called "MyChart".  Sign up information is provided on this After Visit Summary.  MyChart is used to connect with patients for Virtual Visits (Telemedicine).  Patients are able to view lab/test results, encounter notes, upcoming appointments, etc.  Non-urgent messages can be sent to your provider as well.   To learn more about what you can do with MyChart, go to [email or web address].    Your next appointment:   6 month(s)  The format for your next appointment:   In Person  Provider:   Gypsy Balsam, MD    Other Instructions NA

## 2023-02-25 NOTE — Progress Notes (Signed)
 Cardiology Office Note:    Date:  02/25/2023   ID:  Jane Graves, DOB 10/15/1950, MRN [id number]  PCP:  Jerrye Bushy, FNP  Cardiologist:  Gypsy Balsam, MD    Referring MD: Jerrye Bushy, FNP   Chief Complaint  Patient presents with   Follow-up    History of Present Illness:    Lis Savitt is a 73 y.o. female past medical history significant for atypical chest pain, COPD still continues smoke essential hypertension 2 years ago she had coronary CT angio showed moderate disease however no hemodynamically significant lesion based on fractional flow reserve analysis. Comes today to months for follow-up.  Doing well denies having any more episodes dizziness or passing out.  Doing well.  Sadly still continues to smoke  Past Medical History:  Diagnosis Date   Bronchitis    COPD (chronic obstructive pulmonary disease) (HCC)    Cough    Essential (primary) hypertension    Essential tremor    Hyperlipidemia    Swelling    Vitamin D deficiency     Past Surgical History:  Procedure Laterality Date   OVARY SURGERY     removed a cyst/ tubal removed from r side   VAGINAL HYSTERECTOMY      Current Medications: Current Meds  Medication Sig   Acetaminophen (TYLENOL PO) Take 1 tablet by mouth daily as needed (pain).   albuterol (VENTOLIN HFA) 108 (90 Base) MCG/ACT inhaler Inhale 2 puffs into the lungs every 4 (four) hours as needed for wheezing or shortness of breath.   aspirin EC 81 MG tablet Take 81 mg by mouth daily. Swallow whole.   Calcium Carbonate (CALCIUM 500 PO) Take 600 mg by mouth daily.   ezetimibe (ZETIA) 10 MG tablet TAKE 1 TABLET BY MOUTH DAILY   Fluticasone-Umeclidin-Vilant (TRELEGY ELLIPTA) 100-62.5-25 MCG/INH AEPB Inhale 1 puff into the lungs daily.   lisinopril-hydrochlorothiazide (ZESTORETIC) 10-12.5 MG tablet Take 1 tablet by mouth daily.   meloxicam (MOBIC) 15 MG tablet Take 15 mg by mouth daily.   Multiple Vitamin (MULTIVITAMIN) tablet Take 1 tablet by  mouth daily. Unknown strength   Varenicline Tartrate, Starter, (CHANTIX STARTING MONTH PAK) 0.5 MG X 11 & 1 MG X 42 TBPK Take 1 tablet by mouth as directed.   vitamin C (ASCORBIC ACID) 500 MG tablet Take 500 mg by mouth daily.     Allergies:   Propranolol   Social History   Socioeconomic History   Marital status: Divorced    Spouse name: Not on file   Number of children: Not on file   Years of education: Not on file   Highest education level: Not on file  Occupational History   Not on file  Tobacco Use   Smoking status: Every Day    Types: Cigarettes   Smokeless tobacco: Never  Substance and Sexual Activity   Alcohol use: No   Drug use: No   Sexual activity: Not on file  Other Topics Concern   Not on file  Social History Narrative   Not on file   Social Drivers of Health   Financial Resource Strain: Not on file  Food Insecurity: Not on file  Transportation Needs: Not on file  Physical Activity: Not on file  Stress: Not on file  Social Connections: Not on file     Family History: The patient's family history includes Asthma in her brother; Diabetes in her father, mother, and sister; Emphysema in her mother; Heart disease in her brother, father, mother, and  sister; Hyperlipidemia in an other family member; Hypertension in an other family member. ROS:   Please see the history of present illness.    All 14 point review of systems negative except as described per history of present illness  EKGs/Labs/Other Studies Reviewed:         Recent Labs: 08/25/2022: ALT 22  Recent Lipid Panel    Component Value Date/Time   CHOL 137 08/25/2022 1413   TRIG 183 (H) 08/25/2022 1413   HDL 44 08/25/2022 1413   CHOLHDL 3.1 08/25/2022 1413   LDLCALC 63 08/25/2022 1413   LDLDIRECT 98 02/02/2021 1601    Physical Exam:    VS:  BP 112/66 (BP Location: Right Arm, Patient Position: Sitting)   Pulse 72   Ht 5' (1.524 m)   Wt 141 lb 9.6 oz (64.2 kg)   SpO2 93%   BMI 27.65 kg/m      Wt Readings from Last 3 Encounters:  02/25/23 141 lb 9.6 oz (64.2 kg)  08/25/22 136 lb 6.4 oz (61.9 kg)  08/10/21 142 lb (64.4 kg)     GEN:  Well nourished, well developed in no acute distress HEENT: Normal NECK: No JVD; No carotid bruits LYMPHATICS: No lymphadenopathy CARDIAC: RRR, no murmurs, no rubs, no gallops RESPIRATORY:  Clear to auscultation without rales, wheezing or rhonchi  ABDOMEN: Soft, non-tender, non-distended MUSCULOSKELETAL:  No edema; No deformity  SKIN: Warm and dry LOWER EXTREMITIES: no swelling NEUROLOGIC:  Alert and oriented x 3 PSYCHIATRIC:  Normal affect   ASSESSMENT:    1. Coronary artery disease involving native coronary artery of native heart without angina pectoris   2. Essential hypertension   3. Dyslipidemia   4. Smoking    PLAN:    In order of problems listed above:  Coronary disease stable from that point review on guideline directed medical therapy which I will continue. Dyslipidemia I did review K PN which show me LDL of 63 HDL 44 this is from August of this year continue present management. Essential hypertension blood pressure well-controlled. Smoking spent at least 5 minutes talking about smoking she understands she will try to   Medication Adjustments/Labs and Tests Ordered: Current medicines are reviewed at length with the patient today.  Concerns regarding medicines are outlined above.  No orders of the defined types were placed in this encounter.  Medication changes:  Meds ordered this encounter  Medications   Varenicline Tartrate, Starter, (CHANTIX STARTING MONTH PAK) 0.5 MG X 11 & 1 MG X 42 TBPK    Sig: Take 1 tablet by mouth as directed.    Dispense:  53 each    Refill:  0    Signed, Georgeanna Lea, MD, [facility] 02/25/2023 4:25 PM    [facility]

## 2023-05-31 ENCOUNTER — Other Ambulatory Visit: Payer: Self-pay | Admitting: Cardiology

## 2023-06-22 ENCOUNTER — Other Ambulatory Visit: Payer: Self-pay | Admitting: Oral Surgery

## 2023-06-22 DIAGNOSIS — M2740 Unspecified cyst of jaw: Secondary | ICD-10-CM

## 2023-06-22 NOTE — Progress Notes (Signed)
 Pathology Examination  Date:   June 22, 2023    Patient:  Jane Graves    PID:   [id number]  DOB: 17-Aug-1950   Sex:   Female   Referred by Harmon Lights for evaluation of lesion noted on routine panorex.  Chief Complaint:  None  Past Medical History:   High Blood Pressure, Bronchitis, Asthma, Emphysema, COPD, Difficult breathing, Smoker, High Cholesterol, Osteopenia   Medications:   Meloxicam , Lisinopril, HCTZ, Calcium, Pravastatin , Ezetimibe , ASA, Trelegy Ellipta, Albuterol, Tylenol  Allergies:   Propranolol    Radiographs Reviewed:  Panorex: 2.5 cm x 1.5 cm ill defined lesion left mandible extending from tooth # 20 to 23.    Exam: No bony expansion. Several lower teeth with mild caries. No percussion tenderness or mobility. Periodontal depths probed, all <9mm.   Assessment: Possible artifact vs intrabony lesion left mandible    Plan:  CT Maxillofacial, no contrast. Treatment as indicated.      Jane Graves, DMD  June 22, 2023  Patient:  Jane Graves    PID:   [id number]

## 2023-07-02 ENCOUNTER — Ambulatory Visit (HOSPITAL_BASED_OUTPATIENT_CLINIC_OR_DEPARTMENT_OTHER)
Admission: RE | Admit: 2023-07-02 | Discharge: 2023-07-02 | Disposition: A | Discharge status: Home / Self Care | Source: Ambulatory Visit | Attending: Oral Surgery | Admitting: Oral Surgery

## 2023-07-02 DIAGNOSIS — M2740 Unspecified cyst of jaw: Secondary | ICD-10-CM | POA: Insufficient documentation

## 2023-07-15 LAB — HM MAMMOGRAPHY

## 2023-07-18 ENCOUNTER — Encounter: Payer: Self-pay | Admitting: Family Medicine

## 2023-08-22 ENCOUNTER — Encounter: Payer: Self-pay | Admitting: Family Medicine

## 2023-08-22 ENCOUNTER — Other Ambulatory Visit: Payer: Self-pay

## 2023-08-22 ENCOUNTER — Ambulatory Visit (INDEPENDENT_AMBULATORY_CARE_PROVIDER_SITE_OTHER): Admitting: Family Medicine

## 2023-08-22 VITALS — BP 95/59 | HR 66 | Ht 60.0 in | Wt 143.2 lb

## 2023-08-22 DIAGNOSIS — Z720 Tobacco use: Secondary | ICD-10-CM | POA: Diagnosis not present

## 2023-08-22 DIAGNOSIS — J439 Emphysema, unspecified: Secondary | ICD-10-CM

## 2023-08-22 DIAGNOSIS — J40 Bronchitis, not specified as acute or chronic: Secondary | ICD-10-CM | POA: Insufficient documentation

## 2023-08-22 DIAGNOSIS — Z122 Encounter for screening for malignant neoplasm of respiratory organs: Secondary | ICD-10-CM

## 2023-08-22 DIAGNOSIS — Z1211 Encounter for screening for malignant neoplasm of colon: Secondary | ICD-10-CM

## 2023-08-22 DIAGNOSIS — F32A Depression, unspecified: Secondary | ICD-10-CM | POA: Insufficient documentation

## 2023-08-22 DIAGNOSIS — J449 Chronic obstructive pulmonary disease, unspecified: Secondary | ICD-10-CM | POA: Insufficient documentation

## 2023-08-22 DIAGNOSIS — R059 Cough, unspecified: Secondary | ICD-10-CM | POA: Insufficient documentation

## 2023-08-22 DIAGNOSIS — R7401 Elevation of levels of liver transaminase levels: Secondary | ICD-10-CM | POA: Diagnosis not present

## 2023-08-22 DIAGNOSIS — M1990 Unspecified osteoarthritis, unspecified site: Secondary | ICD-10-CM | POA: Insufficient documentation

## 2023-08-22 DIAGNOSIS — F419 Anxiety disorder, unspecified: Secondary | ICD-10-CM | POA: Insufficient documentation

## 2023-08-22 DIAGNOSIS — I10 Essential (primary) hypertension: Secondary | ICD-10-CM | POA: Diagnosis not present

## 2023-08-22 DIAGNOSIS — G250 Essential tremor: Secondary | ICD-10-CM | POA: Diagnosis not present

## 2023-08-22 DIAGNOSIS — Z13820 Encounter for screening for osteoporosis: Secondary | ICD-10-CM

## 2023-08-22 DIAGNOSIS — E663 Overweight: Secondary | ICD-10-CM

## 2023-08-22 DIAGNOSIS — E2839 Other primary ovarian failure: Secondary | ICD-10-CM

## 2023-08-22 DIAGNOSIS — R748 Abnormal levels of other serum enzymes: Secondary | ICD-10-CM | POA: Diagnosis not present

## 2023-08-22 DIAGNOSIS — E559 Vitamin D deficiency, unspecified: Secondary | ICD-10-CM | POA: Insufficient documentation

## 2023-08-22 DIAGNOSIS — I2510 Atherosclerotic heart disease of native coronary artery without angina pectoris: Secondary | ICD-10-CM

## 2023-08-22 DIAGNOSIS — R609 Edema, unspecified: Secondary | ICD-10-CM | POA: Insufficient documentation

## 2023-08-22 DIAGNOSIS — Z7689 Persons encountering health services in other specified circumstances: Secondary | ICD-10-CM

## 2023-08-22 DIAGNOSIS — J45909 Unspecified asthma, uncomplicated: Secondary | ICD-10-CM | POA: Insufficient documentation

## 2023-08-22 HISTORY — DX: Abnormal levels of other serum enzymes: R74.8

## 2023-08-22 MED ORDER — PRAVASTATIN SODIUM 80 MG PO TABS: 80.0000 mg | ORAL_TABLET | Freq: Every evening | ORAL | 3 refills | Status: AC

## 2023-08-22 MED ORDER — EZETIMIBE 10 MG PO TABS: 10.0000 mg | ORAL_TABLET | Freq: Every day | ORAL | 3 refills | Status: AC

## 2023-08-22 MED ORDER — TRELEGY ELLIPTA 100-62.5-25 MCG/ACT IN AEPB
1.0000 | INHALATION_SPRAY | Freq: Every day | RESPIRATORY_TRACT | 3 refills | Status: DC
Start: 2023-08-22 — End: 2023-08-22

## 2023-08-22 MED ORDER — LISINOPRIL-HYDROCHLOROTHIAZIDE 10-12.5 MG PO TABS: 1.0000 | ORAL_TABLET | Freq: Every day | ORAL | 3 refills | Status: AC

## 2023-08-22 MED ORDER — ALBUTEROL SULFATE HFA 108 (90 BASE) MCG/ACT IN AERS: 2.0000 | INHALATION_SPRAY | RESPIRATORY_TRACT | 4 refills | Status: DC | PRN

## 2023-08-22 MED ORDER — MELOXICAM 15 MG PO TABS: 15.0000 mg | ORAL_TABLET | Freq: Every day | ORAL | 0 refills | Status: DC

## 2023-08-22 MED ORDER — TRELEGY ELLIPTA 100-62.5-25 MCG/ACT IN AEPB: 1.0000 | INHALATION_SPRAY | Freq: Every day | RESPIRATORY_TRACT | 0 refills | Status: DC

## 2023-08-22 NOTE — Assessment & Plan Note (Signed)
 Affects hands, head, and neck. History of adverse reaction to propranolol. Considers symptoms minor and declines pharmacotherapy at this time. - Continue to monitor. No changes to management.

## 2023-08-22 NOTE — Progress Notes (Signed)
 New Patient Office Visit  Subjective   Patient ID: Jane Graves, female    DOB: 08/05/1950  Age: 73 y.o. MRN: [id number]  CC:  Chief Complaint  Patient presents with   Establish Care    HPI [name] presents to establish care  Subjective - New patient establishing care, previously with Dr. Mitzie Fellows. - Coronary artery disease, denies chest pain. - Hypertension. - Essential tremor affecting hands, head, and neck. Had an allergic reaction (swelling, rash) to propranolol. Declines further medication at this time. - COPD with occasional asthmatic bronchitis. Reports difficulty breathing in the morning, which improves with Trelegy and coffee. Ran out of Trelegy this morning. 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  Medications Current medications include pravastatin , Zetia , and baby aspirin for CAD. Zestoretic  once daily for hypertension. Trelegy daily for COPD. Also takes vitamin C, a multivitamin, calcium without vitamin D, and meloxicam  daily for joint pain.  PMH, PSH, FH, Social Hx PMH: Coronary artery disease, hypertension, essential tremor, COPD, history of bronchitis with asthmatic features, arthralgia. PSH: Unilateral salpingo-oophorectomy and appendectomy (1978). Hysterectomy [phone]). Bilateral cataract removal. FH: Positive for diabetes (mother, father, sister), asthma (brother), heart disease, COPD, depression, and substance use (siblings). Social Hx: Divorced, lives alone. Retired Lawyer. Three children. Smokes 0.5 packs per day for approximately 50 years, reports cutting down over the past 2 years. History of prior Chantix  use, did not complete starter pack. No alcohol or recreational drug use. Not sexually active.  ROS Constitutional: Denies fever, chills. Cardiovascular: Denies chest pain. Respiratory: Reports dyspnea, particularly in the morning. MSK: Reports pain in knees, hips, and back. Neurological: Reports  tremor.    Outpatient Encounter Medications as of 08/22/2023  Medication Sig   Acetaminophen (TYLENOL PO) Take 1 tablet by mouth daily as needed (pain).   aspirin EC 81 MG tablet Take 81 mg by mouth daily. Swallow whole.   Calcium Carbonate (CALCIUM 500 PO) Take 600 mg by mouth daily.   Multiple Vitamin (MULTIVITAMIN) tablet Take 1 tablet by mouth daily. Unknown strength   vitamin C (ASCORBIC ACID) 500 MG tablet Take 500 mg by mouth daily.   [DISCONTINUED] albuterol  (VENTOLIN  HFA) 108 (90 Base) MCG/ACT inhaler Inhale 2 puffs into the lungs every 4 (four) hours as needed for wheezing or shortness of breath.   [DISCONTINUED] ezetimibe  (ZETIA ) 10 MG tablet Take 1 tablet (10 mg total) by mouth daily.   [DISCONTINUED] Fluticasone-Umeclidin-Vilant (TRELEGY ELLIPTA ) 100-62.5-25 MCG/ACT AEPB Inhale 1 puff into the lungs daily.   [DISCONTINUED] Fluticasone-Umeclidin-Vilant (TRELEGY ELLIPTA ) 100-62.5-25 MCG/INH AEPB Inhale 1 puff into the lungs daily.   [DISCONTINUED] lisinopril -hydrochlorothiazide  (ZESTORETIC ) 10-12.5 MG tablet Take 1 tablet by mouth daily.   [DISCONTINUED] meloxicam  (MOBIC ) 15 MG tablet Take 15 mg by mouth daily.   [DISCONTINUED] pravastatin  (PRAVACHOL ) 80 MG tablet Take 1 tablet (80 mg total) by mouth every evening.   albuterol  (VENTOLIN  HFA) 108 (90 Base) MCG/ACT inhaler Inhale 2 puffs into the lungs every 4 (four) hours as needed for wheezing or shortness of breath.   ezetimibe  (ZETIA ) 10 MG tablet Take 1 tablet (10 mg total) by mouth daily.   Fluticasone-Umeclidin-Vilant (TRELEGY ELLIPTA ) 100-62.5-25 MCG/ACT AEPB Inhale 1 puff into the lungs daily.   lisinopril -hydrochlorothiazide  (ZESTORETIC ) 10-12.5 MG tablet Take 1 tablet by mouth daily.   meloxicam  (MOBIC ) 15 MG tablet Take 1 tablet (15 mg total) by mouth daily.   pravastatin  (PRAVACHOL ) 80 MG tablet Take 1 tablet (80 mg total) by mouth every  evening.   [DISCONTINUED] Varenicline  Tartrate, Starter, (CHANTIX  STARTING MONTH PAK)  0.5 MG X 11 & 1 MG X 42 TBPK Take 1 tablet by mouth as directed. (Patient not taking: Reported on 08/22/2023)   No facility-administered encounter medications on file as of 08/22/2023.    Past Medical History:  Diagnosis Date   Anxiety    occasionally   Arthritis    Asthma    bronchitis w/asthmatic bouts   Atypical chest pain 10/29/2020   Bronchitis    COPD (chronic obstructive pulmonary disease) (HCC)    Coronary artery disease moderate disease based on coronary CT angio in mid RCA 02/02/2021   Cough    Depression    sometimes   Dyspnea on exertion 10/29/2020   Emphysema of lung (HCC)    Essential (primary) hypertension    Essential tremor    Hyperlipidemia    Near syncope 08/25/2022   Swelling    Vitamin D deficiency     Past Surgical History:  Procedure Laterality Date   APPENDECTOMY  1978   EYE SURGERY  2023   cataracts   OVARY SURGERY     removed a cyst/ tubal removed from r side   VAGINAL HYSTERECTOMY      Family History  Problem Relation Age of Onset   Diabetes Mother    Heart disease Mother    Emphysema Mother    COPD Mother    Depression Mother    Diabetes Father    Heart disease Father    Depression Father    Diabetes Sister    Heart disease Sister    COPD Sister    Obesity Sister    Varicose Veins Sister    Heart disease Brother    Asthma Brother    Anxiety disorder Brother    COPD Brother    Depression Brother    Drug abuse Brother    Hyperlipidemia Other    Hypertension Other    Alcohol abuse Brother    Depression Brother    Drug abuse Brother    Early death Brother    Arthritis Sister    Heart disease Brother     Social History   Socioeconomic History   Marital status: Divorced    Spouse name: Not on file   Number of children: Not on file   Years of education: Not on file   Highest education level: Some college, no degree  Occupational History   Not on file  Tobacco Use   Smoking status: Every Day    Current packs/day: 1.00     Average packs/day: 1 pack/day for 47.0 years (47.0 ttl pk-yrs)    Types: Cigarettes   Smokeless tobacco: Never  Substance and Sexual Activity   Alcohol use: No   Drug use: No   Sexual activity: Not Currently    Birth control/protection: Post-menopausal, None  Other Topics Concern   Not on file  Social History Narrative   Not on file   Social Drivers of Health   Financial Resource Strain: Low Risk  (08/16/2023)   Overall Financial Resource Strain (CARDIA)    Difficulty of Paying Living Expenses: Not very hard  Food Insecurity: No Food Insecurity (08/16/2023)   Hunger Vital Sign    Worried About Running Out of Food in the Last Year: Never true    Ran Out of Food in the Last Year: Never true  Transportation Needs: No Transportation Needs (08/16/2023)   PRAPARE - Administrator, Civil Service (Medical): No  Lack of Transportation (Non-Medical): No  Physical Activity: Insufficiently Active (08/16/2023)   Exercise Vital Sign    Days of Exercise per Week: 3 days    Minutes of Exercise per Session: 30 min  Stress: No Stress Concern Present (08/16/2023)   [employer] of Occupational Health - Occupational Stress Questionnaire    Feeling of Stress: Only a little  Social Connections: Moderately Integrated (08/16/2023)   Social Connection and Isolation Panel    Frequency of Communication with Friends and Family: More than three times a week    Frequency of Social Gatherings with Friends and Family: Once a week    Attends Religious Services: More than 4 times per year    Active Member of [employer] or Organizations: Yes    Attends Engineer, structural: More than 4 times per year    Marital Status: Divorced  Intimate Partner Violence: Not At Risk (08/22/2023)   Humiliation, Afraid, Rape, and Kick questionnaire    Fear of Current or Ex-Partner: No    Emotionally Abused: No    Physically Abused: No    Sexually Abused: No    ROS     Objective   BP (!) 95/59    Pulse 66   Ht 5' (1.524 m)   Wt 143 lb 4 oz (65 kg)   SpO2 96%   BMI 27.98 kg/m   Physical Exam General: Alert and oriented HEENT: PERRLA, EOMI, moist mucosal membrane CV: Rate rate rhythm Pulmonary: Lungs clear bilaterally no wheeze or crackles GI: Soft, normal bowel sounds MSK: Strength equal bilaterally Neuro: Slight tremulousness of the head neck and upper extremities. Psych: Pleasant affect Extremities: No pedal edema     Assessment & Plan:   Encounter to establish care  Essential tremor Assessment & Plan: Affects hands, head, and neck. History of adverse reaction to propranolol. Considers symptoms minor and declines pharmacotherapy at this time. - Continue to monitor. No changes to management.   Pulmonary emphysema, unspecified emphysema type (HCC) Assessment & Plan: Stable on daily Trelegy, but ran out of medication this morning. Reports morning dyspnea. Has a significant smoking history of ~50 pack-years. - Refill Trelegy. Send one inhaler to local pharmacy ([facility] in [place]) for immediate use and send remainder of prescription to [facility]. - Order annual low-dose CT chest for lung cancer screening. Counseled on rationale for screening.   Elevated ALT measurement  Elevated alkaline phosphatase level -     Comprehensive metabolic panel with GFR -     Gamma GT  Overweight -     Hemoglobin A1c  Encounter for colorectal cancer screening -     Ambulatory referral to Gastroenterology  Screening for osteoporosis -     DG Bone Density; Future  Screening for lung cancer -     CT CHEST LUNG CANCER SCREENING LOW DOSE WO CONTRAST; Future  Coronary artery disease involving native coronary artery of native heart without angina pectoris Assessment & Plan: History of CAD without history of MI or CVA. Asymptomatic, denies chest pain. Stable on current regimen. - Continue pravastatin , Zetia , and baby aspirin.   Essential (primary) hypertension Assessment &  Plan: Controlled on current medication. - Continue Zestoretic .   Tobacco use Assessment & Plan: Smokes 0.5 packs per day. Expresses interest in quitting. Previous trial of Chantix  was incomplete but tolerated. - Prescribe Chantix . - Counseled on the benefits of combination therapy with nicotine replacement (gum or lozenge) for cravings.   Abnormal liver enzymes Assessment & Plan: Labs from 05/2023 showed elevated  ALT, AST, and Alk Phos. - Order labs including repeat LFTs and an A1C to screen for diabetes given strong family history.   Other orders -     Albuterol  Sulfate HFA; Inhale 2 puffs into the lungs every 4 (four) hours as needed for wheezing or shortness of breath.  Dispense: 3 each; Refill: 4 -     Ezetimibe ; Take 1 tablet (10 mg total) by mouth daily.  Dispense: 90 tablet; Refill: 3 -     Lisinopril -hydroCHLOROthiazide ; Take 1 tablet by mouth daily.  Dispense: 90 tablet; Refill: 3 -     Meloxicam ; Take 1 tablet (15 mg total) by mouth daily.  Dispense: 90 tablet; Refill: 0 -     Pravastatin  Sodium; Take 1 tablet (80 mg total) by mouth every evening.  Dispense: 90 tablet; Refill: 3 -     Trelegy Ellipta ; Inhale 1 puff into the lungs daily.  Dispense: 1 each; Refill: 0    Return in about 3 months (around 11/22/2023) for hld.   Toribio MARLA Slain, MD

## 2023-08-22 NOTE — Assessment & Plan Note (Signed)
 History of CAD without history of MI or CVA. Asymptomatic, denies chest pain. Stable on current regimen. - Continue pravastatin , Zetia , and baby aspirin.

## 2023-08-22 NOTE — Assessment & Plan Note (Signed)
 Stable on daily Trelegy, but ran out of medication this morning. Reports morning dyspnea. Has a significant smoking history of ~50 pack-years. - Refill Trelegy. Send one inhaler to local pharmacy ([facility] in [place]) for immediate use and send remainder of prescription to [facility]. - Order annual low-dose CT chest for lung cancer screening. Counseled on rationale for screening.

## 2023-08-22 NOTE — Assessment & Plan Note (Signed)
 Controlled on current medication. - Continue Zestoretic .

## 2023-08-22 NOTE — Patient Instructions (Signed)
 It was nice to see you today,  We addressed the following topics today: -I am going to order the bone density scan the CT lung cancer screening and colonoscopy.  Someone will call you to schedule each of these. - I am sending in your medications through [facility].  I will also send in the Trelegy to the local pharmacy - I will let you know the results of the lab test when I get them today.  Have a great day,  Rolan Slain, MD

## 2023-08-22 NOTE — Assessment & Plan Note (Signed)
 Labs from 05/2023 showed elevated ALT, AST, and Alk Phos. - Order labs including repeat LFTs and an A1C to screen for diabetes given strong family history.

## 2023-08-22 NOTE — Assessment & Plan Note (Signed)
 Smokes 0.5 packs per day. Expresses interest in quitting. Previous trial of Chantix  was incomplete but tolerated. - Prescribe Chantix . - Counseled on the benefits of combination therapy with nicotine replacement (gum or lozenge) for cravings.

## 2023-08-23 ENCOUNTER — Ambulatory Visit: Payer: Self-pay | Admitting: Family Medicine

## 2023-08-23 LAB — COMPREHENSIVE METABOLIC PANEL WITH GFR
ALT: 23 IU/L (ref 0–32)
AST: 22 IU/L (ref 0–40)
Albumin: 4.5 g/dL (ref 3.8–4.8)
Alkaline Phosphatase: 66 IU/L (ref 44–121)
BUN/Creatinine Ratio: 18 (ref 12–28)
BUN: 15 mg/dL (ref 8–27)
Bilirubin Total: 0.4 mg/dL (ref 0.0–1.2)
CO2: 23 mmol/L (ref 20–29)
Calcium: 10 mg/dL (ref 8.7–10.3)
Chloride: 102 mmol/L (ref 96–106)
Creatinine, Ser: 0.83 mg/dL (ref 0.57–1.00)
Globulin, Total: 2.6 g/dL (ref 1.5–4.5)
Glucose: 95 mg/dL (ref 70–99)
Potassium: 4.2 mmol/L (ref 3.5–5.2)
Sodium: 140 mmol/L (ref 134–144)
Total Protein: 7.1 g/dL (ref 6.0–8.5)
eGFR: 75 mL/min/1.73 (ref >59)

## 2023-08-23 LAB — GAMMA GT: GGT: 22 IU/L (ref 0–60)

## 2023-08-23 LAB — HEMOGLOBIN A1C
Est. average glucose Bld gHb Est-mCnc: 131 mg/dL
Hgb A1c MFr Bld: 6.2 % — ABNORMAL HIGH (ref 4.8–5.6)

## 2023-08-24 ENCOUNTER — Encounter: Payer: Self-pay | Admitting: Cardiology

## 2023-08-24 ENCOUNTER — Ambulatory Visit: Attending: Cardiology | Admitting: Cardiology

## 2023-08-24 VITALS — BP 110/60 | HR 61 | Ht 63.0 in | Wt 146.3 lb

## 2023-08-24 DIAGNOSIS — I10 Essential (primary) hypertension: Secondary | ICD-10-CM | POA: Diagnosis not present

## 2023-08-24 DIAGNOSIS — J439 Emphysema, unspecified: Secondary | ICD-10-CM | POA: Diagnosis not present

## 2023-08-24 DIAGNOSIS — E782 Mixed hyperlipidemia: Secondary | ICD-10-CM

## 2023-08-24 DIAGNOSIS — I2510 Atherosclerotic heart disease of native coronary artery without angina pectoris: Secondary | ICD-10-CM | POA: Diagnosis not present

## 2023-08-24 DIAGNOSIS — Z720 Tobacco use: Secondary | ICD-10-CM

## 2023-08-24 NOTE — Patient Instructions (Addendum)
Medication Instructions:  Your physician recommends that you continue on your current medications as directed. Please refer to the Current Medication list given to you today.  *If you need a refill on your cardiac medications before your next appointment, please call your pharmacy*   Lab Work: Lipid- today If you have labs (blood work) drawn today and your tests are completely normal, you will receive your results only by: MyChart Message (if you have MyChart) OR A paper copy in the mail If you have any lab test that is abnormal or we need to change your treatment, we will call you to review the results.   Testing/Procedures: None Ordered   Follow-Up: At [facility], you and your health needs are our priority.  As part of our continuing mission to provide you with exceptional heart care, we have created designated Provider Care Teams.  These Care Teams include your primary Cardiologist (physician) and Advanced Practice Providers (APPs -  Physician Assistants and Nurse Practitioners) who all work together to provide you with the care you need, when you need it.  We recommend signing up for the patient portal called "MyChart".  Sign up information is provided on this After Visit Summary.  MyChart is used to connect with patients for Virtual Visits (Telemedicine).  Patients are able to view lab/test results, encounter notes, upcoming appointments, etc.  Non-urgent messages can be sent to your provider as well.   To learn more about what you can do with MyChart, go to [email or web address].    Your next appointment:   6 month(s)  The format for your next appointment:   In Person  Provider:   Gypsy Balsam, MD    Other Instructions NA

## 2023-08-24 NOTE — Progress Notes (Signed)
 Cardiology Office Note:    Date:  08/24/2023   ID:  Jane Graves, DOB [birth year], MRN [id number]  PCP:  Carlon Mitzie SAUNDERS, FNP  Cardiologist:  Lamar Fitch, MD    Referring MD: Carlon Mitzie SAUNDERS, FNP   No chief complaint on file.   History of Present Illness:    Jane Graves is a 73 y.o. female past medical history significant for atypical chest pain, COPD, sadly still continues to smoke, 2 years ago she got coronary CT angiogram which showed moderate disease however no hemodynamically significant lesion based on fractional flow reserve.  Comes today 2 months for follow-up.  Cardiac wise doing well.  Denies having chest pain tightness squeezing pressure burning chest sadly still continue to smoke.  She does develop shortness of breath while walking  Past Medical History:  Diagnosis Date   Abnormal liver enzymes 08/22/2023   Anxiety    occasionally   Arthritis    Asthma    bronchitis w/asthmatic bouts   Atypical chest pain 10/29/2020   Bronchitis    COPD (chronic obstructive pulmonary disease) (HCC)    Coronary artery disease moderate disease based on coronary CT angio in mid RCA 02/02/2021   Cough    Depression    sometimes   Dyspnea on exertion 10/29/2020   Emphysema of lung (HCC)    Essential (primary) hypertension    Essential tremor    Hyperlipidemia    Near syncope 08/25/2022   Swelling    Tobacco use 10/29/2020   Vitamin D deficiency     Past Surgical History:  Procedure Laterality Date   APPENDECTOMY  1978   EYE SURGERY  2023   cataracts   OVARY SURGERY     removed a cyst/ tubal removed from r side   VAGINAL HYSTERECTOMY      Current Medications: Current Meds  Medication Sig   Acetaminophen (TYLENOL PO) Take 1 tablet by mouth daily as needed (pain).   albuterol  (VENTOLIN  HFA) 108 (90 Base) MCG/ACT inhaler Inhale 2 puffs into the lungs every 4 (four) hours as needed for wheezing or shortness of breath.   aspirin EC 81 MG tablet Take 81 mg by mouth  daily. Swallow whole.   Calcium Carbonate (CALCIUM 500 PO) Take 600 mg by mouth daily.   ezetimibe  (ZETIA ) 10 MG tablet Take 1 tablet (10 mg total) by mouth daily.   Fluticasone-Umeclidin-Vilant (TRELEGY ELLIPTA ) 100-62.5-25 MCG/ACT AEPB Inhale 1 puff into the lungs daily.   lisinopril -hydrochlorothiazide  (ZESTORETIC ) 10-12.5 MG tablet Take 1 tablet by mouth daily.   meloxicam  (MOBIC ) 15 MG tablet Take 1 tablet (15 mg total) by mouth daily.   Multiple Vitamin (MULTIVITAMIN) tablet Take 1 tablet by mouth daily. Unknown strength   pravastatin  (PRAVACHOL ) 80 MG tablet Take 1 tablet (80 mg total) by mouth every evening.   vitamin C (ASCORBIC ACID) 500 MG tablet Take 500 mg by mouth daily.     Allergies:   Propranolol   Social History   Socioeconomic History   Marital status: Divorced    Spouse name: Not on file   Number of children: Not on file   Years of education: Not on file   Highest education level: Some college, no degree  Occupational History   Not on file  Tobacco Use   Smoking status: Every Day    Current packs/day: 1.00    Average packs/day: 1 pack/day for 47.0 years (47.0 ttl pk-yrs)    Types: Cigarettes   Smokeless tobacco: Never  Substance and Sexual  Activity   Alcohol use: No   Drug use: No   Sexual activity: Not Currently    Birth control/protection: Post-menopausal, None  Other Topics Concern   Not on file  Social History Narrative   Not on file   Social Drivers of Health   Financial Resource Strain: Low Risk  (08/16/2023)   Overall Financial Resource Strain (CARDIA)    Difficulty of Paying Living Expenses: Not very hard  Food Insecurity: No Food Insecurity (08/16/2023)   Hunger Vital Sign    Worried About Running Out of Food in the Last Year: Never true    Ran Out of Food in the Last Year: Never true  Transportation Needs: No Transportation Needs (08/16/2023)   PRAPARE - Administrator, Civil Service (Medical): No    Lack of Transportation  (Non-Medical): No  Physical Activity: Insufficiently Active (08/16/2023)   Exercise Vital Sign    Days of Exercise per Week: 3 days    Minutes of Exercise per Session: 30 min  Stress: No Stress Concern Present (08/16/2023)   [employer] of Occupational Health - Occupational Stress Questionnaire    Feeling of Stress: Only a little  Social Connections: Moderately Integrated (08/16/2023)   Social Connection and Isolation Panel    Frequency of Communication with Friends and Family: More than three times a week    Frequency of Social Gatherings with Friends and Family: Once a week    Attends Religious Services: More than 4 times per year    Active Member of [employer] or Organizations: Yes    Attends Engineer, structural: More than 4 times per year    Marital Status: Divorced     Family History: The patient's family history includes Alcohol abuse in her brother; Anxiety disorder in her brother; Arthritis in her sister; Asthma in her brother; COPD in her brother, mother, and sister; Depression in her brother, brother, father, and mother; Diabetes in her father, mother, and sister; Drug abuse in her brother and brother; Early death in her brother; Emphysema in her mother; Heart disease in her brother, brother, father, mother, and sister; Hyperlipidemia in an other family member; Hypertension in an other family member; Obesity in her sister; Varicose Veins in her sister. ROS:   Please see the history of present illness.    All 14 point review of systems negative except as described per history of present illness  EKGs/Labs/Other Studies Reviewed:    EKG Interpretation Date/Time:  Wednesday August 24 2023 10:19:20 EDT Ventricular Rate:  61 PR Interval:  140 QRS Duration:  76 QT Interval:  434 QTC Calculation: 436 R Axis:   62  Text Interpretation: Normal sinus rhythm Normal ECG When compared with ECG of 25-Aug-2022 13:11, No significant change was found Confirmed by Bernie Charleston  [phone]) on 08/24/2023 10:24:52 AM    Recent Labs: 08/22/2023: ALT 23; BUN 15; Creatinine, Ser 0.83; Potassium 4.2; Sodium 140  Recent Lipid Panel    Component Value Date/Time   CHOL 137 08/25/2022 1413   TRIG 183 (H) 08/25/2022 1413   HDL 44 08/25/2022 1413   CHOLHDL 3.1 08/25/2022 1413   LDLCALC 63 08/25/2022 1413   LDLDIRECT 98 02/02/2021 1601    Physical Exam:    VS:  BP 110/60   Pulse 61   Ht 5' 3 (1.6 m)   Wt 146 lb 4.8 oz (66.4 kg)   SpO2 95%   BMI 25.92 kg/m     Wt Readings from Last 3 Encounters:  08/24/23 146 lb 4.8 oz (66.4 kg)  08/22/23 143 lb 4 oz (65 kg)  02/25/23 141 lb 9.6 oz (64.2 kg)     GEN:  Well nourished, well developed in no acute distress HEENT: Normal NECK: No JVD; No carotid bruits LYMPHATICS: No lymphadenopathy CARDIAC: RRR, no murmurs, no rubs, no gallops RESPIRATORY:  Clear to auscultation without rales, wheezing or rhonchi  ABDOMEN: Soft, non-tender, non-distended MUSCULOSKELETAL:  No edema; No deformity  SKIN: Warm and dry LOWER EXTREMITIES: no swelling NEUROLOGIC:  Alert and oriented x 3 PSYCHIATRIC:  Normal affect   ASSESSMENT:    1. Coronary artery disease involving native coronary artery of native heart without angina pectoris   2. Essential (primary) hypertension   3. Pulmonary emphysema, unspecified emphysema type (HCC)   4. Mixed hyperlipidemia   5. Tobacco use    PLAN:    In order of problems listed above:  Coronary disease moderate based on coronary CT angio.  Risk factors modifications, she is on antiplatelet therapy in form of aspirin which I will continue.  She is also starting pravastatin  which I will continue no symptoms that would indicate worsening of the condition. Essential hypertension blood pressure well-controlled continue present management. Pulmonary physio-.  Noted secondary to smoking.  Again a long discussion at least 5 to 10 minutes about need to quit smoking she understands she try to work on  that. Dyslipidemia she is on pravastatin  80 last cholesterol I have is from August 2024 which is a year ago LDL 63 HDL 44 will repeat fasting lipid profile   Medication Adjustments/Labs and Tests Ordered: Current medicines are reviewed at length with the patient today.  Concerns regarding medicines are outlined above.  Orders Placed This Encounter  Procedures   EKG 12-Lead   Medication changes: No orders of the defined types were placed in this encounter.   Signed, Lamar DOROTHA Fitch, MD, [facility] 08/24/2023 10:40 AM    [facility]

## 2023-08-25 LAB — LIPID PANEL
Chol/HDL Ratio: 2.5 ratio (ref 0.0–4.4)
Cholesterol, Total: 124 mg/dL (ref 100–199)
HDL: 50 mg/dL (ref >39)
LDL Chol Calc (NIH): 58 mg/dL (ref 0–99)
Triglycerides: 83 mg/dL (ref 0–149)
VLDL Cholesterol Cal: 16 mg/dL (ref 5–40)

## 2023-08-26 ENCOUNTER — Ambulatory Visit
Admission: RE | Admit: 2023-08-26 | Discharge: 2023-08-26 | Disposition: A | Discharge status: Home / Self Care | Source: Ambulatory Visit | Attending: Family Medicine | Admitting: Family Medicine

## 2023-08-26 DIAGNOSIS — Z87891 Personal history of nicotine dependence: Secondary | ICD-10-CM | POA: Diagnosis not present

## 2023-08-26 DIAGNOSIS — Z122 Encounter for screening for malignant neoplasm of respiratory organs: Secondary | ICD-10-CM

## 2023-08-31 ENCOUNTER — Ambulatory Visit: Payer: Self-pay | Admitting: Cardiology

## 2023-09-07 ENCOUNTER — Telehealth: Payer: Self-pay | Admitting: *Deleted

## 2023-09-07 NOTE — Telephone Encounter (Signed)
 Contacted pt to let her know that her CT had not been read as of this message.  I called the reading room at [facility] and they were going to put this at the top of the pile so that it could be read soon.  Will route to [place] to have her keep a look out for these results since Dr. Chandra is out of office.

## 2023-09-07 NOTE — Telephone Encounter (Signed)
 Copied from CRM #[id number]. Topic: Clinical - Lab/Test Results >> Sep 07, 2023  9:37 AM Antwanette L wrote: Reason for CRM: Pt is calling to get ct scan results from 08/26/23. Please contact the patient at [phone] when they are ready. >> Sep 07, 2023  9:40 AM Antwanette L wrote: The had a new pt ov with Dr. Chandra on 08/22/23 but the pcp still says Mitzie Fellows FNP

## 2023-09-13 ENCOUNTER — Encounter (INDEPENDENT_AMBULATORY_CARE_PROVIDER_SITE_OTHER): Payer: Self-pay

## 2023-09-14 ENCOUNTER — Encounter: Payer: Self-pay | Admitting: Family Medicine

## 2023-09-15 ENCOUNTER — Telehealth: Payer: Self-pay

## 2023-09-15 ENCOUNTER — Institutional Professional Consult (permissible substitution) (INDEPENDENT_AMBULATORY_CARE_PROVIDER_SITE_OTHER): Admitting: Otolaryngology

## 2023-09-15 NOTE — Addendum Note (Signed)
 Addended by: ZIMMERMAN RUMPLE, Sarajane Fambrough D on: 09/15/2023 05:42 PM   Modules accepted: Orders

## 2023-09-15 NOTE — Telephone Encounter (Signed)
 Pt viewed lab results on My Chart per Dr. Karry note. Routed to PCP.

## 2023-09-15 NOTE — Telephone Encounter (Signed)
 Will fax to the requested location

## 2023-09-15 NOTE — Progress Notes (Deleted)
 Dear Dr. Chandra, Here is my assessment for our mutual patient, Jane Graves. Thank you for allowing me the opportunity to care for your patient. Please do not hesitate to contact me should you have any other questions. Sincerely, Dr. Eldora Blanch  Otolaryngology Clinic Note Referring provider: Dr. Chandra HPI:  Jane Graves is a 73 y.o. female kindly referred by Dr. Chandra for evaluation of ***.   H&N Surgery: *** Personal or FHx of bleeding dz or anesthesia difficulty: no ***  GLP-1: *** AP/AC: ***  Tobacco: yes, 0.5 PPD. Alcohol: ***. Occupation: ***. Lives in *** with ***.  PMHx: Essential Tremor, Emphysema/COPD, CAD, HTN, Elevated LFT  Independent Review of Additional Tests or Records:  CMP 08/22/2023 Dr. Sheryle (08/31/2023) Referral notes reviewed and uploaded or available in chart in media tab - noted parotid lesion, Rx: Ref to ENT CT Face without contrast independently interpreted: noted left deep lobe of parotid lesion, unable to assess comprehensively given lack of contrast; no obvious necrotic adenopathy noted PMH/Meds/All/SocHx/FamHx/ROS:   Past Medical History:  Diagnosis Date   Abnormal liver enzymes 08/22/2023   Anxiety    occasionally   Arthritis    Asthma    bronchitis w/asthmatic bouts   Atypical chest pain 10/29/2020   Bronchitis    COPD (chronic obstructive pulmonary disease) (HCC)    Coronary artery disease moderate disease based on coronary CT angio in mid RCA 02/02/2021   Cough    Depression    sometimes   Dyspnea on exertion 10/29/2020   Emphysema of lung (HCC)    Essential (primary) hypertension    Essential tremor    Hyperlipidemia    Near syncope 08/25/2022   Swelling    Tobacco use 10/29/2020   Vitamin D deficiency      Past Surgical History:  Procedure Laterality Date   APPENDECTOMY  1978   EYE SURGERY  2023   cataracts   OVARY SURGERY     removed a cyst/ tubal removed from r side   VAGINAL HYSTERECTOMY      Family History   Problem Relation Age of Onset   Diabetes Mother    Heart disease Mother    Emphysema Mother    COPD Mother    Depression Mother    Diabetes Father    Heart disease Father    Depression Father    Diabetes Sister    Heart disease Sister    COPD Sister    Obesity Sister    Varicose Veins Sister    Heart disease Brother    Asthma Brother    Anxiety disorder Brother    COPD Brother    Depression Brother    Drug abuse Brother    Hyperlipidemia Other    Hypertension Other    Alcohol abuse Brother    Depression Brother    Drug abuse Brother    Early death Brother    Arthritis Sister    Heart disease Brother      Social Connections: Moderately Integrated (08/16/2023)   Social Connection and Isolation Panel    Frequency of Communication with Friends and Family: More than three times a week    Frequency of Social Gatherings with Friends and Family: Once a week    Attends Religious Services: More than 4 times per year    Active Member of [employer] or Organizations: Yes    Attends Engineer, structural: More than 4 times per year    Marital Status: Divorced      Current  Outpatient Medications:    Acetaminophen (TYLENOL PO), Take 1 tablet by mouth daily as needed (pain)., Disp: , Rfl:    albuterol  (VENTOLIN  HFA) 108 (90 Base) MCG/ACT inhaler, Inhale 2 puffs into the lungs every 4 (four) hours as needed for wheezing or shortness of breath., Disp: 3 each, Rfl: 4   aspirin EC 81 MG tablet, Take 81 mg by mouth daily. Swallow whole., Disp: , Rfl:    Calcium Carbonate (CALCIUM 500 PO), Take 600 mg by mouth daily., Disp: , Rfl:    ezetimibe  (ZETIA ) 10 MG tablet, Take 1 tablet (10 mg total) by mouth daily., Disp: 90 tablet, Rfl: 3   Fluticasone-Umeclidin-Vilant (TRELEGY ELLIPTA ) 100-62.5-25 MCG/ACT AEPB, Inhale 1 puff into the lungs daily., Disp: 1 each, Rfl: 0   lisinopril -hydrochlorothiazide  (ZESTORETIC ) 10-12.5 MG tablet, Take 1 tablet by mouth daily., Disp: 90 tablet, Rfl: 3    meloxicam  (MOBIC ) 15 MG tablet, Take 1 tablet (15 mg total) by mouth daily., Disp: 90 tablet, Rfl: 0   Multiple Vitamin (MULTIVITAMIN) tablet, Take 1 tablet by mouth daily. Unknown strength, Disp: , Rfl:    pravastatin  (PRAVACHOL ) 80 MG tablet, Take 1 tablet (80 mg total) by mouth every evening., Disp: 90 tablet, Rfl: 3   vitamin C (ASCORBIC ACID) 500 MG tablet, Take 500 mg by mouth daily., Disp: , Rfl:    Physical Exam:   There were no vitals taken for this visit.  Salient findings:  CN II-XII intact *** Bilateral EAC clear and TM intact with well pneumatized middle ear spaces Weber 512: *** Rinne 512: AC > BC b/l *** Rine 1024: AC > BC b/l *** Anterior rhinoscopy: Septum ***; bilateral inferior turbinates with *** No lesions of oral cavity/oropharynx; dentition *** No obviously palpable neck masses/lymphadenopathy/thyromegaly No respiratory distress or stridor***  Seprately Identifiable Procedures:  Prior to initiating any procedures, risks/benefits/alternatives were explained to the patient and verbal consent obtained. None***  Impression & Plans:  [name] is a 73 y.o. female with ***  No diagnosis found.   - f/u ***  See below regarding exact medications prescribed this encounter including dosages and route: No orders of the defined types were placed in this encounter.     Thank you for allowing me the opportunity to care for your patient. Please do not hesitate to contact me should you have any other questions.  Sincerely, Eldora Blanch, MD Otolaryngologist (ENT), [facility] Phone: [phone] Fax: [phone]  09/15/2023, 2:50 PM   MDM:  Level *** Complexity/Problems addressed: *** Data complexity: *** independent review of *** - Morbidity: ***  - Prescription Drug prescribed or managed: ***

## 2023-09-20 NOTE — Addendum Note (Signed)
 Addended by: ZIMMERMAN RUMPLE, Alonia Dibuono D on: 09/20/2023 03:12 PM   Modules accepted: Orders

## 2023-09-27 ENCOUNTER — Encounter (INDEPENDENT_AMBULATORY_CARE_PROVIDER_SITE_OTHER): Payer: Self-pay

## 2023-10-05 ENCOUNTER — Ambulatory Visit (INDEPENDENT_AMBULATORY_CARE_PROVIDER_SITE_OTHER): Admitting: Otolaryngology

## 2023-10-05 ENCOUNTER — Encounter (INDEPENDENT_AMBULATORY_CARE_PROVIDER_SITE_OTHER): Payer: Self-pay | Admitting: Otolaryngology

## 2023-10-05 VITALS — BP 125/80 | HR 80 | Ht 63.0 in | Wt 140.0 lb

## 2023-10-05 DIAGNOSIS — F17210 Nicotine dependence, cigarettes, uncomplicated: Secondary | ICD-10-CM

## 2023-10-05 DIAGNOSIS — K118 Other diseases of salivary glands: Secondary | ICD-10-CM | POA: Diagnosis not present

## 2023-10-05 DIAGNOSIS — F17200 Nicotine dependence, unspecified, uncomplicated: Secondary | ICD-10-CM

## 2023-10-05 NOTE — Progress Notes (Signed)
 Dear Dr. Chandra, Here is my assessment for our mutual patient, Jane Graves. Thank you for allowing me the opportunity to care for your patient. Please do not hesitate to contact me should you have any other questions. Sincerely, Dr. Eldora Blanch  Otolaryngology Clinic Note Referring provider: Dr. Chandra HPI:  Jane Graves is a 73 y.o. female kindly referred by Dr. Chandra for evaluation of parotid lesion  Initial visit (09/2023):  She was noted to have a jaw cyst that she went to see an oral surgeon, who recommended a CT. CT was done which did not show any obvious lesions, but was noted to have a left parotid nodule incidentally noted. She does smoke. She has not felt the nodule.  Patient otherwise denies: - dysphagia, odynophagia, unintentional weight loss - changes in voice, new shortness of breath, hemoptysis - ear pain, neck masses, no facial numbness, no facial weakness. No skin cancer  Workup so far: CT  ENT Surgery: no Personal or FHx of bleeding dz or anesthesia difficulty: no  AP/AC: ASA 81  Tobacco: yes, 0.5 PPD (25 pack year)  PMHx: Essential Tremor, Emphysema/COPD, CAD, HTN, Elevated LFT  Independent Review of Additional Tests or Records:  CMP 08/22/2023: BUN/Cr 15/0.83 Dr. Sheryle (08/31/2023) Referral notes reviewed and uploaded or available in chart in media tab - noted parotid lesion, Rx: Ref to ENT CT Face without contrast independently interpreted: noted left deep lobe of parotid lesion, unable to assess comprehensively given lack of contrast; no obvious necrotic adenopathy noted PMH/Meds/All/SocHx/FamHx/ROS:   Past Medical History:  Diagnosis Date   Abnormal liver enzymes 08/22/2023   Anxiety    occasionally   Arthritis    Asthma    bronchitis w/asthmatic bouts   Atypical chest pain 10/29/2020   Bronchitis    COPD (chronic obstructive pulmonary disease) (HCC)    Coronary artery disease moderate disease based on coronary CT angio in mid RCA 02/02/2021    Cough    Depression    sometimes   Dyspnea on exertion 10/29/2020   Emphysema of lung (HCC)    Essential (primary) hypertension    Essential tremor    Hyperlipidemia    Near syncope 08/25/2022   Swelling    Tobacco use 10/29/2020   Vitamin D deficiency      Past Surgical History:  Procedure Laterality Date   APPENDECTOMY  1978   EYE SURGERY  2023   cataracts   OVARY SURGERY     removed a cyst/ tubal removed from r side   VAGINAL HYSTERECTOMY      Family History  Problem Relation Age of Onset   Diabetes Mother    Heart disease Mother    Emphysema Mother    COPD Mother    Depression Mother    Diabetes Father    Heart disease Father    Depression Father    Diabetes Sister    Heart disease Sister    COPD Sister    Obesity Sister    Varicose Veins Sister    Heart disease Brother    Asthma Brother    Anxiety disorder Brother    COPD Brother    Depression Brother    Drug abuse Brother    Hyperlipidemia Other    Hypertension Other    Alcohol abuse Brother    Depression Brother    Drug abuse Brother    Early death Brother    Arthritis Sister    Heart disease Brother      Social Connections: Moderately  Integrated (08/16/2023)   Social Connection and Isolation Panel    Frequency of Communication with Friends and Family: More than three times a week    Frequency of Social Gatherings with Friends and Family: Once a week    Attends Religious Services: More than 4 times per year    Active Member of [employer] or Organizations: Yes    Attends Engineer, structural: More than 4 times per year    Marital Status: Divorced      Current Outpatient Medications:    Acetaminophen (TYLENOL PO), Take 1 tablet by mouth daily as needed (pain)., Disp: , Rfl:    albuterol  (VENTOLIN  HFA) 108 (90 Base) MCG/ACT inhaler, Inhale 2 puffs into the lungs every 4 (four) hours as needed for wheezing or shortness of breath., Disp: 3 each, Rfl: 4   aspirin EC 81 MG tablet, Take 81 mg by  mouth daily. Swallow whole., Disp: , Rfl:    Calcium Carbonate (CALCIUM 500 PO), Take 600 mg by mouth daily., Disp: , Rfl:    ezetimibe  (ZETIA ) 10 MG tablet, Take 1 tablet (10 mg total) by mouth daily., Disp: 90 tablet, Rfl: 3   Fluticasone-Umeclidin-Vilant (TRELEGY ELLIPTA ) 100-62.5-25 MCG/ACT AEPB, Inhale 1 puff into the lungs daily., Disp: 1 each, Rfl: 0   lisinopril -hydrochlorothiazide  (ZESTORETIC ) 10-12.5 MG tablet, Take 1 tablet by mouth daily., Disp: 90 tablet, Rfl: 3   meloxicam  (MOBIC ) 15 MG tablet, Take 1 tablet (15 mg total) by mouth daily., Disp: 90 tablet, Rfl: 0   Multiple Vitamin (MULTIVITAMIN) tablet, Take 1 tablet by mouth daily. Unknown strength, Disp: , Rfl:    pravastatin  (PRAVACHOL ) 80 MG tablet, Take 1 tablet (80 mg total) by mouth every evening., Disp: 90 tablet, Rfl: 3   vitamin C (ASCORBIC ACID) 500 MG tablet, Take 500 mg by mouth daily., Disp: , Rfl:    Physical Exam:   BP 125/80 (BP Location: Right Arm, Patient Position: Sitting, Cuff Size: Large)   Pulse 80   Ht 5' 3 (1.6 m)   Wt 140 lb (63.5 kg)   SpO2 91%   BMI 24.80 kg/m   Salient findings:  CN II-XII intact Bilateral EAC clear and TM intact with well pneumatized middle ear spaces Anterior rhinoscopy: Septum intact; bilateral inferior turbinates without significant hypertrophy No lesions of oral cavity/oropharynx; dentition poor; easily expressible salive from parotid ducts bilaterally Unable to feel parotid nodule No obviously palpable neck masses/lymphadenopathy/thyromegaly No respiratory distress or stridor  Seprately Identifiable Procedures:  Prior to initiating any procedures, risks/benefits/alternatives were explained to the patient and verbal consent obtained. None  Impression & Plans:  Jane Graves is a 73 y.o. female with:  1. Parotid nodule   2. Tobacco use disorder    Left parotid nodule deep lobe, does have h/o smoking. Otherwise asymptomatic. Given the patient's tobacco use, I also  discussed cessation and options for cessation, including counseling. Counseled patient on the dangers of tobacco use, advised patient to stop smoking. Total time spent with this was 4 minutes.   Discussed options: would rec FNA to start, Will call her in 6 weeks with results  See below regarding exact medications prescribed this encounter including dosages and route: No orders of the defined types were placed in this encounter.     Thank you for allowing me the opportunity to care for your patient. Please do not hesitate to contact me should you have any other questions.  Sincerely, Eldora Blanch, MD Otolaryngologist (ENT), [facility] Phone: [phone] Fax:  [phone]  10/05/2023, 9:02 AM   I have personally spent 45 minutes involved in face-to-face and non-face-to-face activities for this patient on the day of the visit.  Professional time spent excludes any procedures performed but includes the following activities, in addition to those noted in the documentation: preparing to see the patient (review of outside documentation and results), performing a medically appropriate examination, counseling, documenting in the electronic health record, independently interpreting results (CT).

## 2023-10-05 NOTE — Patient Instructions (Signed)
 I have ordered an imaging study for you to complete prior to your next visit. Please call [facility] Scheduling at [phone] to schedule your imaging if you have not received a call within 24 hours. If you are unable to complete your imaging study prior to your next scheduled visit please call our office to let us  know.

## 2023-10-06 ENCOUNTER — Encounter (HOSPITAL_COMMUNITY): Payer: Self-pay

## 2023-10-06 ENCOUNTER — Ambulatory Visit (HOSPITAL_COMMUNITY)

## 2023-10-06 NOTE — Progress Notes (Signed)
 Jenna Cordella LABOR, MD  Michaelene Setter PROCEDURE / BIOPSY REVIEW Date: 10/05/23  Requested Biopsy site: parotid, left Reason for request: nodule Imaging review: Best seen on CT  Decision: Approved Imaging modality to perform: Ultrasound Schedule with: No sedation / Local anesthetic Schedule for: Any VIR  Additional comments: @Schedulers .  Please contact me with questions, concerns, or if issue pertaining to this request arise.  Cordella LABOR Jenna, MD [facility]       Previous Messages    ----- Message ----- From: Simora Dingee Sent: 10/05/2023  11:23 AM EDT To: Telena Peyser; Ir Procedure Requests Subject: US  FNA BIOPSY SALIVARY GLAND PAROTID GLAND E*  Procedure : US  FNA BIOPSY SALIVARY GLAND PAROTID GLAND EA ADDT'L LESION  Reason : left parotid nodule, FNA - not core biospy Dx: Parotid nodule [K11.8 (ICD-10-CM)]  Ordering Comments  left parotid nodule, FNA - not core biospy    History : CT Chest lung cancer screening  Provider : Tobie Eldora NOVAK, MD  Contact :  [phone]

## 2023-10-21 DIAGNOSIS — M8589 Other specified disorders of bone density and structure, multiple sites: Secondary | ICD-10-CM | POA: Diagnosis not present

## 2023-10-21 DIAGNOSIS — Z13820 Encounter for screening for osteoporosis: Secondary | ICD-10-CM | POA: Diagnosis not present

## 2023-10-21 DIAGNOSIS — Z780 Asymptomatic menopausal state: Secondary | ICD-10-CM | POA: Diagnosis not present

## 2023-10-21 LAB — HM DEXA SCAN

## 2023-10-23 ENCOUNTER — Other Ambulatory Visit: Payer: Self-pay | Admitting: Family Medicine

## 2023-11-07 ENCOUNTER — Encounter (HOSPITAL_COMMUNITY): Payer: Self-pay

## 2023-11-07 ENCOUNTER — Ambulatory Visit (HOSPITAL_COMMUNITY)
Admission: RE | Admit: 2023-11-07 | Discharge: 2023-11-07 | Disposition: A | Source: Ambulatory Visit | Attending: Otolaryngology

## 2023-11-07 ENCOUNTER — Other Ambulatory Visit (INDEPENDENT_AMBULATORY_CARE_PROVIDER_SITE_OTHER): Payer: Self-pay | Admitting: Otolaryngology

## 2023-11-07 ENCOUNTER — Ambulatory Visit (HOSPITAL_COMMUNITY)
Admission: RE | Admit: 2023-11-07 | Discharge: 2023-11-07 | Disposition: A | Discharge status: Home / Self Care | Source: Ambulatory Visit | Attending: Otolaryngology | Admitting: Otolaryngology

## 2023-11-07 DIAGNOSIS — K118 Other diseases of salivary glands: Secondary | ICD-10-CM

## 2023-11-07 DIAGNOSIS — F17200 Nicotine dependence, unspecified, uncomplicated: Secondary | ICD-10-CM

## 2023-11-07 MED ORDER — LIDOCAINE HCL (PF) 1 % IJ SOLN
5.0000 mL | Freq: Once | INTRAMUSCULAR | Status: AC
  Administered 2023-11-07: 5 mL via INTRADERMAL

## 2023-11-07 NOTE — Procedures (Signed)
  Procedure:  US  FNA L parotid lesion 25g x5 Preprocedure diagnosis: The encounter diagnosis was Parotid nodule. Postprocedure diagnosis: same EBL:    minimal Complications:   none immediate  See full dictation in [employer].  CHARM Toribio Faes MD Main # [phone] Pager  [phone] Mobile [phone]

## 2023-11-09 ENCOUNTER — Encounter: Payer: Self-pay | Admitting: Family Medicine

## 2023-11-09 LAB — CYTOLOGY - NON PAP

## 2023-11-17 ENCOUNTER — Encounter (INDEPENDENT_AMBULATORY_CARE_PROVIDER_SITE_OTHER): Payer: Self-pay | Admitting: Otolaryngology

## 2023-11-17 ENCOUNTER — Ambulatory Visit (INDEPENDENT_AMBULATORY_CARE_PROVIDER_SITE_OTHER): Admitting: Otolaryngology

## 2023-11-17 DIAGNOSIS — F17210 Nicotine dependence, cigarettes, uncomplicated: Secondary | ICD-10-CM | POA: Diagnosis not present

## 2023-11-17 DIAGNOSIS — K118 Other diseases of salivary glands: Secondary | ICD-10-CM

## 2023-11-17 DIAGNOSIS — F17200 Nicotine dependence, unspecified, uncomplicated: Secondary | ICD-10-CM

## 2023-11-17 NOTE — Progress Notes (Signed)
 Dear Dr. Chandra, Here is my assessment for our mutual patient, Jane Graves. Thank you for allowing me the opportunity to care for your patient. Please do not hesitate to contact me should you have any other questions. Sincerely, Dr. Eldora Blanch  Otolaryngology Clinic Note Referring provider: Dr. Chandra HPI:  Jane Graves is a 73 y.o. female kindly referred by Dr. Chandra for evaluation of parotid lesion  Initial visit (09/2023):  She was noted to have a jaw cyst that she went to see an oral surgeon, who recommended a CT. CT was done which did not show any obvious lesions, but was noted to have a left parotid nodule incidentally noted. She does smoke. She has not felt the nodule.  Patient otherwise denies: - dysphagia, odynophagia, unintentional weight loss - changes in voice, new shortness of breath, hemoptysis - ear pain, neck masses, no facial numbness, no facial weakness. No skin cancer  --------------------------------------------------------- 11/17/2023 The patient gave consent to have this visit done by telemedicine / virtual visit, two identifiers were used to identify patient. This is also consent for access the chart and treat the patient via this visit. The patient is located in [place].  I, the provider, am at the office.  We spent 10 minutes together for the visit.  Joined by telephone  Discussed FNA results. Otherwise asymptomatic as above. We discussed options and she would like to avoid any surgery and observe.   ENT Surgery: no Personal or FHx of bleeding dz or anesthesia difficulty: no  AP/AC: ASA 81  Tobacco: yes, 0.5 PPD (25 pack year)  PMHx: Essential Tremor, Emphysema/COPD, CAD, HTN, Elevated LFT  Independent Review of Additional Tests or Records:  CMP 08/22/2023: BUN/Cr 15/0.83 Dr. Sheryle (08/31/2023) Referral notes reviewed and uploaded or available in chart in media tab - noted parotid lesion, Rx: Ref to ENT CT Face without contrast independently interpreted:  noted left deep lobe of parotid lesion, unable to assess comprehensively given lack of contrast; no obvious necrotic adenopathy noted FNA 11/07/2023:  - No malignant cells identified  - Oncocytic epithelium, lymphoid fragments, and normal salivary gland  tissue, see comment -- The differential diagnosis includes oncocytoma, oncocytosis,  intraparotid lymph node, and Warthin's tumor.   PMH/Meds/All/SocHx/FamHx/ROS:   Past Medical History:  Diagnosis Date   Abnormal liver enzymes 08/22/2023   Anxiety    occasionally   Arthritis    Asthma    bronchitis w/asthmatic bouts   Atypical chest pain 10/29/2020   Bronchitis    COPD (chronic obstructive pulmonary disease) (HCC)    Coronary artery disease moderate disease based on coronary CT angio in mid RCA 02/02/2021   Cough    Depression    sometimes   Dyspnea on exertion 10/29/2020   Emphysema of lung (HCC)    Essential (primary) hypertension    Essential tremor    Hyperlipidemia    Near syncope 08/25/2022   Swelling    Tobacco use 10/29/2020   Vitamin D deficiency      Past Surgical History:  Procedure Laterality Date   APPENDECTOMY  1978   EYE SURGERY  2023   cataracts   OVARY SURGERY     removed a cyst/ tubal removed from r side   VAGINAL HYSTERECTOMY      Family History  Problem Relation Age of Onset   Diabetes Mother    Heart disease Mother    Emphysema Mother    COPD Mother    Depression Mother    Diabetes Father  Heart disease Father    Depression Father    Diabetes Sister    Heart disease Sister    COPD Sister    Obesity Sister    Varicose Veins Sister    Heart disease Brother    Asthma Brother    Anxiety disorder Brother    COPD Brother    Depression Brother    Drug abuse Brother    Hyperlipidemia Other    Hypertension Other    Alcohol abuse Brother    Depression Brother    Drug abuse Brother    Early death Brother    Arthritis Sister    Heart disease Brother      Social Connections:  Moderately Integrated (08/16/2023)   Social Connection and Isolation Panel    Frequency of Communication with Friends and Family: More than three times a week    Frequency of Social Gatherings with Friends and Family: Once a week    Attends Religious Services: More than 4 times per year    Active Member of [employer] or Organizations: Yes    Attends Engineer, Structural: More than 4 times per year    Marital Status: Divorced      Current Outpatient Medications:    Acetaminophen (TYLENOL PO), Take 1 tablet by mouth daily as needed (pain)., Disp: , Rfl:    albuterol  (VENTOLIN  HFA) 108 (90 Base) MCG/ACT inhaler, Inhale 2 puffs into the lungs every 4 (four) hours as needed for wheezing or shortness of breath., Disp: 3 each, Rfl: 4   aspirin EC 81 MG tablet, Take 81 mg by mouth daily. Swallow whole., Disp: , Rfl:    Calcium Carbonate (CALCIUM 500 PO), Take 600 mg by mouth daily., Disp: , Rfl:    ezetimibe  (ZETIA ) 10 MG tablet, Take 1 tablet (10 mg total) by mouth daily., Disp: 90 tablet, Rfl: 3   Fluticasone-Umeclidin-Vilant (TRELEGY ELLIPTA ) 100-62.5-25 MCG/ACT AEPB, Inhale 1 puff into the lungs daily., Disp: 1 each, Rfl: 0   lisinopril -hydrochlorothiazide  (ZESTORETIC ) 10-12.5 MG tablet, Take 1 tablet by mouth daily., Disp: 90 tablet, Rfl: 3   meloxicam  (MOBIC ) 15 MG tablet, TAKE 1 TABLET BY MOUTH DAILY, Disp: 90 tablet, Rfl: 3   Multiple Vitamin (MULTIVITAMIN) tablet, Take 1 tablet by mouth daily. Unknown strength, Disp: , Rfl:    pravastatin  (PRAVACHOL ) 80 MG tablet, Take 1 tablet (80 mg total) by mouth every evening., Disp: 90 tablet, Rfl: 3   vitamin C (ASCORBIC ACID) 500 MG tablet, Take 500 mg by mouth daily., Disp: , Rfl:    Physical Exam:   There were no vitals taken for this visit.  Salient findings:  Unable to perform physical exam as this was a telephone visit.   Seprately Identifiable Procedures:  Prior to initiating any procedures, risks/benefits/alternatives were explained  to the patient and verbal consent obtained. None  Impression & Plans:  Jane Graves is a 73 y.o. female with:  1. Parotid nodule   2. Tobacco use disorder    Left parotid nodule deep lobe, does have h/o smoking. Not palpable. Otherwise asymptomatic. Discussed FNA results. Seems most likely benign but we discussed only way to know definitively would be excision; risks for this discussed but she opted for observation, which is reasonable.  F/u in 6 months with CT, sooner as necessary; return precautions discussed  See below regarding exact medications prescribed this encounter including dosages and route: No orders of the defined types were placed in this encounter.     Thank you for allowing  me the opportunity to care for your patient. Please do not hesitate to contact me should you have any other questions.  Sincerely, Eldora Blanch, MD Otolaryngologist (ENT), [facility] Phone: [phone] Fax: [phone]  11/17/2023, 6:31 PM   MDM: see above, low

## 2023-11-17 NOTE — Patient Instructions (Signed)
 I have ordered an imaging study for you to complete prior to your next visit. Please call [facility] Scheduling at [phone] to schedule your imaging if you have not received a call within 24 hours. If you are unable to complete your imaging study prior to your next scheduled visit please call our office to let us  know. --- have the CT scan done in late May and see Dr. Tobie in middle of June

## 2023-11-21 ENCOUNTER — Encounter: Payer: Self-pay | Admitting: Physician Assistant

## 2023-11-21 ENCOUNTER — Encounter: Payer: Self-pay | Admitting: Family Medicine

## 2023-11-21 ENCOUNTER — Ambulatory Visit (INDEPENDENT_AMBULATORY_CARE_PROVIDER_SITE_OTHER): Admitting: Family Medicine

## 2023-11-21 VITALS — BP 128/85 | HR 77 | Temp 98.4°F | Ht 60.0 in | Wt 139.0 lb

## 2023-11-21 DIAGNOSIS — Z720 Tobacco use: Secondary | ICD-10-CM | POA: Diagnosis not present

## 2023-11-21 DIAGNOSIS — J40 Bronchitis, not specified as acute or chronic: Secondary | ICD-10-CM

## 2023-11-21 DIAGNOSIS — Z0000 Encounter for general adult medical examination without abnormal findings: Secondary | ICD-10-CM | POA: Diagnosis not present

## 2023-11-21 DIAGNOSIS — M8580 Other specified disorders of bone density and structure, unspecified site: Secondary | ICD-10-CM | POA: Diagnosis not present

## 2023-11-21 DIAGNOSIS — F419 Anxiety disorder, unspecified: Secondary | ICD-10-CM | POA: Diagnosis not present

## 2023-11-21 MED ORDER — AZITHROMYCIN 250 MG PO TABS: ORAL_TABLET | ORAL | 0 refills | Status: AC

## 2023-11-21 MED ORDER — BUPROPION HCL ER (XL) 150 MG PO TB24: 150.0000 mg | ORAL_TABLET | Freq: Every day | ORAL | 1 refills | Status: DC

## 2023-11-21 NOTE — Progress Notes (Signed)
 Established Patient Office Visit  Subjective   Patient ID: Jane Graves, female    DOB: 10-Jan-1950  Age: 73 y.o. MRN: [id number]  Chief Complaint  Patient presents with   Medical Management of Chronic Issues    HPI  Subjective - Reports feeling better today, though not fully recovered from an illness lasting about one week. Presents with a productive cough with green sputum. Reports feeling like they have had a low-grade fever, felt in the eyes. Also reports some lack of appetite. - Reports a sore throat that is intermittent, mainly occurring after a severe bout of coughing. - Reports nasal stuffiness, but no significant rhinorrhea. - Reports intermittent headaches, managed with acetaminophen. - For cough, has used honey cough syrup (now finished) and sugar-free cough drops.  Medications: Acetaminophen for headache, honey cough syrup, sugar-free cough drops. Multivitamin. Calcium 600 mg.  PMH, PSH, FH, Social Hx: PMHx: Osteopenia, history of elevated calcium. Seen by cardiology since last visit, no medication changes. History of benign prostate finding on biopsy, with plan for repeat CT scan in 6 months with Dr. Tobie. PSH: Prostate biopsy. Pending major dental surgery (two extractions, bone grinding for partial denture fitting) with Dr. Sheryle, awaiting cardiology clearance. FH: Son (44) and grandson (25) live nearby; reports concern for their substance use (Adderall, marijuana, possible methamphetamine use by son) and the stress it causes. Son has unresolved grief from partner's death 4-5 years ago and is resistant to therapy. Social Hx: Smoker, expresses desire to quit but feels current personal stress is a barrier. Has tried nicotine patches, gum, and lozenges without success. Also tried varenicline  (Chantix ) in the past, which did not work well. Reports the habit is more hand-to-mouth than craving-based. Open to trying bupropion. Lives in a mobile home she  owns.  ROS: Constitutional: Positive for subjective low-grade fever, lack of appetite. HEENT: Positive for sore throat, nasal congestion. Negative for runny nose. Positive for headache. Respiratory: Positive for productive cough with green sputum. Musculoskeletal: Negative for body aches.    The ASCVD Risk score (Arnett DK, et al., 2019) failed to calculate for the following reasons:   The valid total cholesterol range is 130 to 320 mg/dL  Health Maintenance Due  Topic Date Due   Medicare Annual Wellness (AWV)  Never done   Hepatitis C Screening  Never done   DTaP/Tdap/Td (1 - Tdap) Never done   Pneumococcal Vaccine: 50+ Years (1 of 2 - PCV) Never done   Colonoscopy  Never done   Zoster Vaccines- Shingrix (1 of 2) Never done   Influenza Vaccine  Never done   COVID-19 Vaccine (4 - 2025-26 season) 09/05/2023      Objective:     BP 128/85   Pulse 77   Temp 98.4 F (36.9 C) (Oral)   Ht 5' (1.524 m)   Wt 139 lb 0.6 oz (63.1 kg)   SpO2 96%   BMI 27.15 kg/m    Physical Exam Gen: alert, oriented PULM: Lungs are clear to auscultation bilaterally. No wheezing or crackles heard. Psych: pleasant affect   No results found for any visits on 11/21/23.      Assessment & Plan:   Osteopenia, unspecified location Assessment & Plan: - Bone density scan at [facility] showed osteopenia, with left femoral neck T-score of -2.4, nearing osteoporosis range. Reports taking calcium 600 mg daily, previously on 1200 mg but reduced due to a past high calcium level. Does not take a separate vitamin D supplement. - Add over-the-counter Vitamin  D 1000-2000 units daily. Can be taken as a combination product with calcium.   Healthcare maintenance Assessment & Plan: - Colonoscopy consultation is scheduled for 01/09/2024. - Mammogram was completed in spring/early summer. - Discussed vaccinations. Advised to get influenza and COVID-19 vaccines.  - Dental surgery pending cardiology  clearance.   Bronchitis Assessment & Plan: - Presents with a one-week history of productive cough with green sputum, nasal congestion, and subjective fever. Exam is unremarkable with clear lungs. Given smoking history, concern for secondary bacterial infection or exacerbation of chronic bronchitis exists. - Zithromax, send to [facility] in [place]. - Follow up in 6-8 weeks.   Tobacco use Assessment & Plan: Bupropion 150mg  daily prescribed   Anxiety Assessment & Plan: Stress surrounding son and grandson.  Prescribed bupropion 150mg  daily.  F/u approx 6-8 weeks   Other orders -     Azithromycin; Take 2 tablets on day 1, then 1 tablet daily on days 2 through 5  Dispense: 6 tablet; Refill: 0 -     buPROPion HCl ER (XL); Take 1 tablet (150 mg total) by mouth daily.  Dispense: 90 tablet; Refill: 1     Return in about 7 weeks (around 01/09/2024) for mood, smoking.    Toribio MARLA Slain, MD

## 2023-11-21 NOTE — Assessment & Plan Note (Signed)
-   Bone density scan at [facility] showed osteopenia, with left femoral neck T-score of -2.4, nearing osteoporosis range. Reports taking calcium 600 mg daily, previously on 1200 mg but reduced due to a past high calcium level. Does not take a separate vitamin D supplement. - Add over-the-counter Vitamin D 1000-2000 units daily. Can be taken as a combination product with calcium.

## 2023-11-21 NOTE — Assessment & Plan Note (Signed)
-   Colonoscopy consultation is scheduled for 01/09/2024. - Mammogram was completed in spring/early summer. - Discussed vaccinations. Advised to get influenza and COVID-19 vaccines.  - Dental surgery pending cardiology clearance.

## 2023-11-21 NOTE — Assessment & Plan Note (Signed)
 Bupropion 150mg  daily prescribed

## 2023-11-21 NOTE — Assessment & Plan Note (Signed)
 Stress surrounding son and grandson.  Prescribed bupropion 150mg  daily.  F/u approx 6-8 weeks

## 2023-11-21 NOTE — Patient Instructions (Signed)
 It was nice to see you today,  We addressed the following topics today: - I have prescribed a 5-day course of an antibiotic (Zithromax) for your cough. I will send this to your local pharmacy, [facility] in [place]. - I am also prescribing bupropion 150 mg to help with smoking cessation and stress. I will send this to your mail-in pharmacy, [facility]. - I would like you to start taking an over-the-counter Vitamin D supplement, 1000 to 2000 units daily. You can find products that combine this with calcium. - Please hold off on the flu shot until you have finished the antibiotic. You can then schedule a nurse visit to get the shot here, or you can get it at a pharmacy. - It is a good idea to get the COVID-19 vaccine as well. You can get this at a pharmacy on the same day as your flu shot. We do not carry the COVID vaccine here. - Please schedule a follow-up appointment in 6 to 8 weeks.  Have a great day,  Rolan Slain, MD

## 2023-11-21 NOTE — Assessment & Plan Note (Addendum)
-   Presents with a one-week history of productive cough with green sputum, nasal congestion, and subjective fever. Exam is unremarkable with clear lungs. Given smoking history, concern for secondary bacterial infection or exacerbation of chronic bronchitis exists. - Zithromax, send to [facility] in [place]. - Follow up in 6-8 weeks.

## 2023-11-30 ENCOUNTER — Other Ambulatory Visit: Payer: Self-pay | Admitting: Family Medicine

## 2023-11-30 NOTE — Telephone Encounter (Signed)
 Copied from CRM [id number]. Topic: Clinical - Medication Refill >> Nov 30, 2023  9:34 AM Pinkey ORN wrote: Medication: Fluticasone-Umeclidin-Vilant (TRELEGY ELLIPTA ) 100-62.5-25 MCG/ACT AEPB  Has the patient contacted their pharmacy? Yes (Agent: If no, request that the patient contact the pharmacy for the refill. If patient does not wish to contact the pharmacy document the reason why and proceed with request.) (Agent: If yes, when and what did the pharmacy advise?)  This is the patient's preferred pharmacy:   [facility] [place],  - [address] [place]  [address] Phone: [phone] Fax: [phone]  Is this the correct pharmacy for this prescription? Yes If no, delete pharmacy and type the correct one.   Has the prescription been filled recently? No  Is the patient out of the medication? No  Has the patient been seen for an appointment in the last year OR does the patient have an upcoming appointment? Yes  Can we respond through MyChart? Yes  Agent: Please be advised that Rx refills may take up to 3 business days. We ask that you follow-up with your pharmacy.

## 2023-12-05 MED ORDER — TRELEGY ELLIPTA 100-62.5-25 MCG/ACT IN AEPB: 1.0000 | INHALATION_SPRAY | Freq: Every day | RESPIRATORY_TRACT | 3 refills | Status: AC

## 2023-12-12 ENCOUNTER — Telehealth: Payer: Self-pay | Admitting: *Deleted

## 2023-12-12 NOTE — Telephone Encounter (Signed)
    Primary Cardiologist: None  Chart reviewed as part of pre-operative protocol coverage. Simple dental extractions are considered low risk procedures per guidelines and generally do not require any specific cardiac clearance. It is also generally accepted that for simple extractions and dental cleanings, there is no need to interrupt blood thinner therapy.   SBE prophylaxis is not required for the patient.  I will route this recommendation to the requesting party via Epic fax function and remove from pre-op pool.  Please call with questions.  Orren LOISE Fabry, PA-C 12/12/2023, 3:21 PM

## 2023-12-12 NOTE — Telephone Encounter (Signed)
   Pre-operative Risk Assessment    Patient Name: Jane Graves  DOB: Feb 16, 1950 MRN: [id number]     Request for Surgical Clearance    Procedure:  Dental Extraction - Amount of Teeth to be Pulled:  2  Date of Surgery:  Clearance TBD                                 Surgeon:  [name], D.M.D., P.A. Surgeon's Group or Practice Name:  [facility] Phone number:  [phone] Fax number:  [phone]   Type of Clearance Requested:   - Medical  - Pharmacy:  [facility] Any directions?   Type of Anesthesia:  General    Additional requests/questions:  Please provide Cardiac risk assessment  Signed, Arloa Donovan Dines   12/12/2023, 2:32 PM

## 2024-01-09 ENCOUNTER — Encounter: Payer: Self-pay | Admitting: Physician Assistant

## 2024-01-09 ENCOUNTER — Ambulatory Visit: Admitting: Family Medicine

## 2024-01-09 ENCOUNTER — Ambulatory Visit (INDEPENDENT_AMBULATORY_CARE_PROVIDER_SITE_OTHER): Admitting: Physician Assistant

## 2024-01-09 VITALS — BP 100/60 | HR 76 | Ht 59.0 in | Wt 135.5 lb

## 2024-01-09 DIAGNOSIS — F17210 Nicotine dependence, cigarettes, uncomplicated: Secondary | ICD-10-CM

## 2024-01-09 DIAGNOSIS — I2510 Atherosclerotic heart disease of native coronary artery without angina pectoris: Secondary | ICD-10-CM | POA: Diagnosis not present

## 2024-01-09 DIAGNOSIS — Z860101 Personal history of adenomatous and serrated colon polyps: Secondary | ICD-10-CM | POA: Diagnosis not present

## 2024-01-09 DIAGNOSIS — J449 Chronic obstructive pulmonary disease, unspecified: Secondary | ICD-10-CM | POA: Diagnosis not present

## 2024-01-09 DIAGNOSIS — Z860100 Personal history of colon polyps, unspecified: Secondary | ICD-10-CM

## 2024-01-09 MED ORDER — NA SULFATE-K SULFATE-MG SULF 17.5-3.13-1.6 GM/177ML PO SOLN: 1.0000 | Freq: Once | ORAL | 0 refills | Status: AC

## 2024-01-09 NOTE — Progress Notes (Signed)
 "    01/09/2024 Jane Graves [id number] Oct 03, 1950  Referring provider: Chandra Toribio POUR, MD Primary GI doctor: Dr. Charlanne  ASSESSMENT AND PLAN:  Personal history of TA polyps 06/13/2013 colonoscopy with Dr. Charlanne in [place] showed good prep, 1 cm flat TA polyp descending colon, 4 mm HP polyp sigmoid, diverticula sigmoid, small IH Feels she had another in 2020 with Dr. Towana but I do not have that colonoscopy Denies family history of colon cancer, blood in stool, changes in bowel habits Will schedule colonoscopy with Dr. Charlanne at [facility], We have discussed the risks of bleeding, infection, perforation, medication reactions, and remote risk of death associated with colonoscopy. All questions were answered and the patient acknowledges these risk and wishes to proceed.  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 female with a past medical history listed below presents for evaluation of colonoscopy.   Discussed the use of AI scribe software for clinical note transcription with the patient, who gave verbal consent to proceed.  History of Present Illness   Jane Graves is a 74 year old female with a history of colonic tubular adenomatous polyp who presents for colonoscopy surveillance follow up.  Colonoscopy in 2015 identified a one centimeter flat tubular adenomatous polyp in the descending colon. A follow-up colonoscopy was performed in 2020, but the report is unavailable. She believes she is due for another colonoscopy in five years.  Denies  family history of colon cancer, hematochezia, or change in bowel habits. Bowel movements occur daily or every other day, without constipation or diarrhea.  Continues to smoke and has COPD, with occasional shortness of breath. Uses albuterol  as needed and is not on supplemental oxygen.  History of non-obstructive coronary artery disease, with a low risk flow test in 2020 and a recent echocardiogram in 2024 showing mild mitral regurgitation, normal ejection fraction, and grade one diastolic dysfunction. No chest pain reported.        She  reports that she has been smoking cigarettes. She has a 47 pack-year smoking history. She has never used smokeless tobacco. She reports that she does not drink alcohol and does not use drugs.  RELEVANT GI HISTORY, IMAGING AND LABS: Results          CBC No results found for: WBC, RBC, HGB, HCT, PLT, MCV, MCH, MCHC, RDW, LYMPHSABS, MONOABS, EOSABS, BASOSABS No results for input(s): HGB in the last 8760 hours.  CMP     Component Value Date/Time   NA 140 08/22/2023 1007   K 4.2 08/22/2023 1007   CL 102 08/22/2023 1007   CO2 23 08/22/2023 1007   GLUCOSE 95 08/22/2023 1007   BUN 15 08/22/2023 1007   CREATININE 0.83 08/22/2023 1007   CALCIUM 10.0 08/22/2023 1007   PROT 7.1 08/22/2023 1007   ALBUMIN 4.5 08/22/2023 1007   AST 22 08/22/2023 1007   ALT 23 08/22/2023 1007   ALKPHOS 66 08/22/2023 1007   BILITOT 0.4 08/22/2023 1007      Latest Ref Rng & Units 08/22/2023   10:07 AM 08/25/2022  2:13 PM 05/11/2021    9:26 AM  Hepatic Function  Total Protein 6.0 - 8.5 g/dL 7.1     Albumin 3.8 - 4.8 g/dL 4.5     AST 0 - 40 IU/L 22  18  21    ALT 0 - 32 IU/L 23  22  20    Alk Phosphatase 44 - 121 IU/L 66     Total Bilirubin 0.0 - 1.2 mg/dL 0.4         Current Medications:   Current Outpatient Medications (Cardiovascular):    ezetimibe  (ZETIA ) 10 MG tablet, Take 1 tablet (10 mg total) by mouth daily.    lisinopril -hydrochlorothiazide  (ZESTORETIC ) 10-12.5 MG tablet, Take 1 tablet by mouth daily.   pravastatin  (PRAVACHOL ) 80 MG tablet, Take 1 tablet (80 mg total) by mouth every evening.  Current Outpatient Medications (Respiratory):    albuterol  (VENTOLIN  HFA) 108 (90 Base) MCG/ACT inhaler, Inhale 2 puffs into the lungs every 4 (four) hours as needed for wheezing or shortness of breath.   Fluticasone-Umeclidin-Vilant (TRELEGY ELLIPTA ) 100-62.5-25 MCG/ACT AEPB, Inhale 1 puff into the lungs daily.  Current Outpatient Medications (Analgesics):    Acetaminophen (TYLENOL PO), Take 1 tablet by mouth daily as needed (pain).   aspirin EC 81 MG tablet, Take 81 mg by mouth daily. Swallow whole.   meloxicam  (MOBIC ) 15 MG tablet, TAKE 1 TABLET BY MOUTH DAILY  Current Outpatient Medications (Other):    Calcium Carbonate (CALCIUM 500 PO), Take 600 mg by mouth daily.   Cholecalciferol (VITAMIN D-3) 25 MCG (1000 UT) CAPS, Take 1 capsule by mouth daily.   Multiple Vitamin (MULTIVITAMIN) tablet, Take 1 tablet by mouth daily. Unknown strength   Na Sulfate-K Sulfate-Mg Sulfate concentrate (SUPREP BOWEL PREP KIT) 17.5-3.13-1.6 GM/177ML SOLN, Take 1 kit (354 mLs total) by mouth once for 1 dose.   vitamin C (ASCORBIC ACID) 500 MG tablet, Take 500 mg by mouth daily.   buPROPion  (WELLBUTRIN  XL) 150 MG 24 hr tablet, Take 1 tablet (150 mg total) by mouth daily. (Patient not taking: Reported on 01/09/2024)  Medical History:  Past Medical History:  Diagnosis Date   Abnormal liver enzymes 08/22/2023   Anxiety    occasionally   Arthritis    Asthma    bronchitis w/asthmatic bouts   Atypical chest pain 10/29/2020   Bronchitis    Colon polyps    COPD (chronic obstructive pulmonary disease) (HCC)    Coronary artery disease moderate disease based on coronary CT angio in mid RCA 02/02/2021   Cough    Depression    sometimes   Dyspnea on exertion 10/29/2020   Emphysema of lung (HCC)    Essential (primary) hypertension     Essential tremor    Hyperlipidemia    Near syncope 08/25/2022   Swelling    Tobacco use 10/29/2020   Vitamin D deficiency    Allergies: Allergies[1]   Surgical History:  She  has a past surgical history that includes Vaginal hysterectomy; Cataract extraction, bilateral (2023); and Exploratory laparotomy (Right, 1978). Family History:  Her family history includes Alcohol abuse in her brother; Anxiety disorder in her brother; Asthma in her brother; COPD in her brother, mother, and sister; Colon polyps in her father; Depression in her brother, brother, father, and mother; Diabetes in her father, mother, and sister; Drug abuse in her brother, brother, and son; Early death in her brother; Emphysema in her mother; Heart disease in her brother, brother, daughter, father, mother, and sister; Hyperlipidemia in an other family member; Hypertension in  an other family member; Obesity in her sister; Rheum arthritis in her sister; Varicose Veins in her sister.  REVIEW OF SYSTEMS  : All other systems reviewed and negative except where noted in the History of Present Illness.  PHYSICAL EXAM: BP 100/60 (BP Location: Left Arm, Patient Position: Sitting, Cuff Size: Normal)   Pulse 76   Ht 4' 11 (1.499 m) Comment: height measured without shoes  Wt 135 lb 8 oz (61.5 kg)   BMI 27.37 kg/m  Physical Exam   GENERAL APPEARANCE: Well nourished, in no apparent distress HEENT: No cervical lymphadenopathy, unremarkable thyroid, sclerae anicteric, conjunctiva pink RESPIRATORY: Respiratory effort normal, BS equal bilateral without rales, rhonchi, wheezing CARDIO: RRR with no MRGs, peripheral pulses intact ABDOMEN: Soft, non distended, active bowel sounds in all 4 quadrants, no tenderness to palpation, no rebound, no mass appreciated RECTAL: declines MUSCULOSKELETAL: Full ROM, normal gait, without edema SKIN: Dry, intact without rashes or lesions. No jaundice. NEURO: Alert, oriented, no focal deficits PSYCH:  Cooperative, normal mood and affect.      Alan JONELLE Coombs, PA-C 11:58 AM      [1]  Allergies Allergen Reactions   Propranolol Hives and Shortness Of Breath   Wellbutrin  [Bupropion ]     Cause essential tremors to get worse   "

## 2024-01-09 NOTE — Patient Instructions (Addendum)
 You have been scheduled for a colonoscopy. Please follow written instructions given to you at your visit today.   If you use inhalers (even only as needed), please bring them with you on the day of your procedure.  DO NOT TAKE 7 DAYS PRIOR TO TEST- Trulicity (dulaglutide) Ozempic, Wegovy (semaglutide) Mounjaro, Zepbound (tirzepatide) Bydureon Bcise (exanatide extended release)  DO NOT TAKE 1 DAY PRIOR TO YOUR TEST Rybelsus (semaglutide) Adlyxin (lixisenatide) Victoza (liraglutide) Byetta (exanatide) ___________________________________________________________________________    Due to recent changes in healthcare laws, you may see the results of your imaging and laboratory studies on MyChart before your provider has had a chance to review them.  We understand that in some cases there may be results that are confusing or concerning to you. Not all laboratory results come back in the same time frame and the provider may be waiting for multiple results in order to interpret others.  Please give us  48 hours in order for your provider to thoroughly review all the results before contacting the office for clarification of your results.    I appreciate the  opportunity to care for you  Thank You   [facility]

## 2024-01-13 ENCOUNTER — Telehealth: Payer: Self-pay | Admitting: Family Medicine

## 2024-01-13 NOTE — Telephone Encounter (Unsigned)
 Copied from CRM [id number]. Topic: General - Other >> Jan 13, 2024 11:51 AM Tiffini S wrote: Reason for CRM: Patient came into the office- the receptionist texted the provider and she was told that she must reschedule for 01/09/24- she reschedule the appointment for 02/07/24 but the appointment for 01/09/24 is listed as a no show appointment  Patient wants to be sure that she is not charged a no show fee/ patient is on a fixed income and cannot afford the fee- her sister helps her to her appointments for transportation   Please call the patient back at [phone] for an update

## 2024-01-16 ENCOUNTER — Encounter: Payer: Self-pay | Admitting: Gastroenterology

## 2024-01-16 ENCOUNTER — Other Ambulatory Visit: Payer: Self-pay | Admitting: Family Medicine

## 2024-01-16 ENCOUNTER — Telehealth: Payer: Self-pay

## 2024-01-16 NOTE — Telephone Encounter (Signed)
 Copied from CRM #[id number]. Topic: Clinical - Medication Question >> Jan 16, 2024 12:37 PM Lauren C wrote: Reason for CRM: Pt would like to just Dr. Chandra that she is no longer taking the wellbutrin  due to it increasing her essential tremors. Please update chart to show this medication is no longer being taken per patients' request.

## 2024-01-29 ENCOUNTER — Other Ambulatory Visit: Payer: Self-pay | Admitting: Family Medicine

## 2024-01-31 NOTE — H&P (Signed)
" °  Patient referred by DDS for extraction teeth # 28, 32, removal bilateral mandibular lingual tori.   CC: No pain. Not sure why teeth and tori need to be removed.   Past Medical History:  High Blood Pressure, Bronchitis, Asthma, Emphysema, COPD, Difficult breathing, Smoker, High Cholesterol, Osteopenia, Dental anxiety   Medications: Meloxicam , Lisinopril , HCTZ, Calcium, Pravastatin , Ezetimibe , ASA, Trelegy Ellipta , Albuterol , Tylenol    Allergies:     Propranolol    Surgeries:   Ovary repair, Eye surgery, Hysterectomy         Social History       Smoking:            Alcohol: Drug use:                             Exam: Deep class V decay # 28. Tooth # 32 with gingival recession, exposed roots. Bilateral mandibular tori present.   No purulence, edema, fluctuance, trismus. Oral cancer screening negative. Pharynx clear. No lymphadenopathy.  Panorex: Decay # 28, bone loss # 32.  Assessment: Non-restorable tooth # 28 and 32, Bilateral mandibular lingual tori.       Plan:   Extract teeth # 38, 32, removal bilateral mandibular lingual tori. GA. Hospital day surgery.              Rx: none              Risks and complications explained. Questions answered.   Jane Graves, DMD  "

## 2024-02-01 ENCOUNTER — Other Ambulatory Visit: Payer: Self-pay

## 2024-02-01 ENCOUNTER — Encounter (HOSPITAL_COMMUNITY): Payer: Self-pay | Admitting: Oral Surgery

## 2024-02-01 NOTE — Progress Notes (Signed)
 SDW call  Patient was given pre-op instructions over the phone. Patient verbalized understanding of instructions provided.  Denied any SOB, fever or cough   PCP - Dr. Flynn Cardiologist - Dr. Bernie Pulmonary:    PPM/ICD - denies Device Orders - n/a Rep Notified - n/a   Chest x-ray - denies EKG -  08-24-23 Stress Test -denies ECHO - 09-29-22 Cardiac Cath - denies  Sleep Study/sleep apnea/CPAP- denies  Fasting Blood sugar range- No DM  Blood Thinner Instructions: denies Aspirin Instructions: Continue aspirin per Surgeon's office.   ERAS Protcol - NPO  Anesthesia review:   Your procedure is scheduled on Friday February 03, 2024  Report to [facility] Main Entrance A at  5:30 A.M., then check in with the Admitting office.  Call this number if you have problems the morning of surgery:  [phone]   If you have any questions prior to your surgery date call [phone]: Open Monday-Friday 8am-4pm If you experience any cold or flu symptoms such as cough, fever, chills, shortness of breath, etc. between now and your scheduled surgery, please notify us  at the above number    Remember:  Do not eat or drink after midnight the night before your surgery    Take these medicines the morning of surgery with A SIP OF WATER:  Aspirin, Zetia , Trelegy Ellipta   As needed: Tylenol, Albuterol  Inhaler  As of today, STOP  Aleve, Naproxen, Ibuprofen, Motrin, Advil, Goody's, BC's, all herbal medications, fish oil, and all vitamins, including Mobic .

## 2024-02-02 NOTE — Progress Notes (Signed)
 Anesthesia Chart Review:  Case: [id number] Date/Time: 02/03/24 0700   Procedure: DENTAL RESTORATION/EXTRACTIONS   Anesthesia type: General   Pre-op diagnosis: DENTAL CARIES   Location: [facility] OR ROOM 04 / [facility] OR   Surgeons: Sheryle Hamilton, DMD       DISCUSSION: Patient is a 74 year old female scheduled for the above procedure.  History includes smoking, COPD/emphysema, asthma, HTN, HLD, CAD, near syncope, DOE, prediabetes,  essential tremor, elevated LFTs (normal 08/22/2023).  Last cardiology office visit with Dr. Bernie was on 08/24/2023. CCTA in 11/2020 for atypical chest pain and showed CAC 534 (94th percentile) with possible significant RCA stenosis but low probability for hemodynamically significant lesion base of FFR analysis. ASA and statin recommended, as well as smoking cessation. No new or worsening symptoms at last visit. No new testing ordered with six month follow-up planned. Dr. Sheryle did reach out to cardiology. Case considered low risk, no SBE prophylaxis required per cardiology APP.    She had ENT evaluation by Dr. 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 Cytology showed did not reveal any malignant cells identified, oncocytic epithelium, lymphoid fragments, and normal salivary gland tissue noted with differential diagnoses including oncocytoma, oncocytosis, intraparotid lymph node, and Warthin's tumor. She preferred serial imaging initially over excision.    A1c 6.2% on 08/22/2023.  She reported instructions to continue aspirin.  Esthesia team to evaluate on the day of surgery.  VS: Ht 4' 11 (1.499 m)   Wt 60.3 kg   BMI 26.86 kg/m   PROVIDERS: Chandra Toribio POUR, MD is PCP  Bernie Charleston, MD is cardiologist  Charlanne Groom, MD is GI Tobie Comp, MD is ENT   LABS: Most recent lab results in [facility] include: Lab Results  Component Value Date   GLUCOSE 95 08/22/2023   CHOL 124 08/24/2023   TRIG 83 08/24/2023   HDL 50 08/24/2023   LDLCALC 58  08/24/2023   ALT 23 08/22/2023   AST 22 08/22/2023   NA 140 08/22/2023   K 4.2 08/22/2023   CL 102 08/22/2023   CREATININE 0.83 08/22/2023   BUN 15 08/22/2023   CO2 23 08/22/2023   HGBA1C 6.2 (H) 08/22/2023    IMAGES: CT CHest LCS 08/26/2023: IMPRESSION: Lung-RADS 2, benign appearance or behavior. Continue annual screening with low-dose chest CT without contrast in 12 months. - Aortic Atherosclerosis (ICD10-I70.0) and Emphysema (ICD10-J43.9).  CT Maxillofacial 07/02/2023: IMPRESSION: 1. No convincing mandible cystic lesion, and no acute or suspicious osseous abnormality of the facial bones. 2. Incidental circumscribed, 16 mm soft tissue nodule in the deep lobe of the Left Parotid Space. 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 Follow-up with ENT recommended. 3. Chronic cervical spine degeneration. Right carotid calcified atherosclerosis. Cerebral white matter changes most commonly due to chronic small vessel disease.  EKG:   CV: Echo 09/29/2022: IMPRESSIONS   1. Left ventricular ejection fraction, by estimation, is 60 to 65%. The  left ventricle has normal function. The left ventricle has no regional  wall motion abnormalities. Left ventricular diastolic parameters are  consistent with Grade I diastolic  dysfunction (impaired relaxation). The average left ventricular global  longitudinal strain is -19.2 %. The global longitudinal strain is normal.   2. Right ventricular systolic function is normal. The right ventricular  size is normal.   3. The mitral valve is normal in structure. Mild mitral valve  regurgitation. No evidence of mitral stenosis.   4. The aortic valve is tricuspid. Aortic valve regurgitation is not  visualized. No aortic stenosis is present.   5. Aortic Normal DTA.   6. The inferior vena cava is normal in size with  greater than 50%  respiratory variability, suggesting right atrial pressure of 3 mmHg.    Long term patch monitor 08/25/2022 - 09/08/2022: Summary conclusions: 5 episode of supraventricular tachycardia with the longest episodes of 7 beats at rate of 179 bpm, asymptomatic.   CTA Coronary with CT FFR 11/07/2020: IMPRESSION: 1. Coronary calcium score of 534. This was 65 percentile for age and sex matched control. 2. Normal coronary origin with right dominance. 3. CAD-RADS 3 - moderate stenosis. 4. Possibly hemodynamically significant ostial RCA stenosis, will perform FFR analysis FFR:  1. Left main: 0.99 2. LAD: Prox 0.98, mid 0.87, distal 0.8 3.  LCX: Prox 0.98, mid 0.97, distal 0.92 4.  RCA: Prox 0.97, mid 0.9, distal 0.88  Conclusion: Low probability for hemodynamically significant lesion base of FFR analysis.  Past Medical History:  Diagnosis Date   Abnormal liver enzymes 08/22/2023   Anxiety    occasionally   Arthritis    Asthma    bronchitis w/asthmatic bouts   Atypical chest pain 10/29/2020   Bronchitis    Colon polyps    COPD (chronic obstructive pulmonary disease) (HCC)    Coronary artery disease moderate disease based on coronary CT angio in mid RCA 02/02/2021   Cough    Depression    sometimes   Dyspnea on exertion 10/29/2020   Emphysema of lung (HCC)    Essential (primary) hypertension    Essential tremor    Hyperlipidemia    Near syncope 08/25/2022   Pre-diabetes    Swelling    Tobacco use 10/29/2020   Vitamin D deficiency     Past Surgical History:  Procedure Laterality Date   CATARACT EXTRACTION, BILATERAL  2023   removed a couple months apart   EXPLORATORY LAPAROTOMY Right 1978   with appendix removal and removed a cyst/ tubal removed from r side   VAGINAL HYSTERECTOMY      MEDICATIONS:  Acetaminophen (TYLENOL PO)   aspirin EC 81 MG tablet   Calcium Carbonate (CALCIUM 500 PO)   Cholecalciferol (VITAMIN D-3) 25 MCG (1000 UT) CAPS    ezetimibe  (ZETIA ) 10 MG tablet   Fluticasone-Umeclidin-Vilant (TRELEGY ELLIPTA ) 100-62.5-25 MCG/ACT AEPB   lisinopril -hydrochlorothiazide  (ZESTORETIC ) 10-12.5 MG tablet   meloxicam  (MOBIC ) 15 MG tablet   Multiple Vitamin (MULTIVITAMIN) tablet   pravastatin  (PRAVACHOL ) 80 MG tablet   vitamin C (ASCORBIC ACID) 500 MG tablet   albuterol  (VENTOLIN  HFA) 108 (90 Base) MCG/ACT inhaler    Isaiah Ruder, PA-C Surgical Short Stay/Anesthesiology [facility] Phone [phone] [facility] Phone [phone] 02/02/2024 12:33 PM

## 2024-02-03 ENCOUNTER — Encounter (HOSPITAL_COMMUNITY)
Admission: RE | Disposition: A | Discharge status: Home / Self Care | Payer: Self-pay | Source: Home / Self Care | Attending: Oral Surgery

## 2024-02-03 ENCOUNTER — Ambulatory Visit (HOSPITAL_COMMUNITY): Payer: Self-pay | Admitting: Vascular Surgery

## 2024-02-03 ENCOUNTER — Ambulatory Visit (HOSPITAL_COMMUNITY)
Admission: RE | Admit: 2024-02-03 | Discharge: 2024-02-03 | Disposition: A | Discharge status: Home / Self Care | Attending: Oral Surgery | Admitting: Oral Surgery

## 2024-02-03 DIAGNOSIS — K06010 Localized gingival recession, unspecified: Secondary | ICD-10-CM | POA: Insufficient documentation

## 2024-02-03 DIAGNOSIS — K029 Dental caries, unspecified: Secondary | ICD-10-CM | POA: Diagnosis not present

## 2024-02-03 DIAGNOSIS — I6789 Other cerebrovascular disease: Secondary | ICD-10-CM | POA: Insufficient documentation

## 2024-02-03 DIAGNOSIS — I10 Essential (primary) hypertension: Secondary | ICD-10-CM | POA: Diagnosis not present

## 2024-02-03 DIAGNOSIS — Z79899 Other long term (current) drug therapy: Secondary | ICD-10-CM | POA: Insufficient documentation

## 2024-02-03 DIAGNOSIS — I2510 Atherosclerotic heart disease of native coronary artery without angina pectoris: Secondary | ICD-10-CM

## 2024-02-03 DIAGNOSIS — F17210 Nicotine dependence, cigarettes, uncomplicated: Secondary | ICD-10-CM | POA: Diagnosis not present

## 2024-02-03 DIAGNOSIS — M8580 Other specified disorders of bone density and structure, unspecified site: Secondary | ICD-10-CM | POA: Insufficient documentation

## 2024-02-03 DIAGNOSIS — E7800 Pure hypercholesterolemia, unspecified: Secondary | ICD-10-CM | POA: Insufficient documentation

## 2024-02-03 DIAGNOSIS — F32A Depression, unspecified: Secondary | ICD-10-CM | POA: Insufficient documentation

## 2024-02-03 DIAGNOSIS — M1990 Unspecified osteoarthritis, unspecified site: Secondary | ICD-10-CM | POA: Insufficient documentation

## 2024-02-03 DIAGNOSIS — J439 Emphysema, unspecified: Secondary | ICD-10-CM | POA: Insufficient documentation

## 2024-02-03 DIAGNOSIS — Z7982 Long term (current) use of aspirin: Secondary | ICD-10-CM | POA: Insufficient documentation

## 2024-02-03 DIAGNOSIS — Z7951 Long term (current) use of inhaled steroids: Secondary | ICD-10-CM | POA: Insufficient documentation

## 2024-02-03 DIAGNOSIS — I700 Atherosclerosis of aorta: Secondary | ICD-10-CM | POA: Insufficient documentation

## 2024-02-03 DIAGNOSIS — M270 Developmental disorders of jaws: Secondary | ICD-10-CM | POA: Insufficient documentation

## 2024-02-03 DIAGNOSIS — M8588 Other specified disorders of bone density and structure, other site: Secondary | ICD-10-CM | POA: Insufficient documentation

## 2024-02-03 DIAGNOSIS — F419 Anxiety disorder, unspecified: Secondary | ICD-10-CM | POA: Insufficient documentation

## 2024-02-03 DIAGNOSIS — F430 Acute stress reaction: Secondary | ICD-10-CM | POA: Insufficient documentation

## 2024-02-03 DIAGNOSIS — F17200 Nicotine dependence, unspecified, uncomplicated: Secondary | ICD-10-CM | POA: Insufficient documentation

## 2024-02-03 HISTORY — DX: Prediabetes: R73.03

## 2024-02-03 LAB — CBC
HCT: 43.9 % (ref 36.0–46.0)
Hemoglobin: 14.8 g/dL (ref 12.0–15.0)
MCH: 33.5 pg (ref 26.0–34.0)
MCHC: 33.7 g/dL (ref 30.0–36.0)
MCV: 99.3 fL (ref 80.0–100.0)
Platelets: 179 10*3/uL (ref 150–400)
RBC: 4.42 MIL/uL (ref 3.87–5.11)
RDW: 14.2 % (ref 11.5–15.5)
WBC: 8.3 10*3/uL (ref 4.0–10.5)
nRBC: 0 % (ref 0.0–0.2)

## 2024-02-03 LAB — BASIC METABOLIC PANEL WITH GFR
Anion gap: 11 (ref 5–15)
BUN: 16 mg/dL (ref 8–23)
CO2: 27 mmol/L (ref 22–32)
Calcium: 9.8 mg/dL (ref 8.9–10.3)
Chloride: 103 mmol/L (ref 98–111)
Creatinine, Ser: 0.86 mg/dL (ref 0.44–1.00)
GFR, Estimated: >60 mL/min
Glucose, Bld: 108 mg/dL — ABNORMAL HIGH (ref 70–99)
Potassium: 3.6 mmol/L (ref 3.5–5.1)
Sodium: 141 mmol/L (ref 135–145)

## 2024-02-03 MED ORDER — CEFAZOLIN SODIUM-DEXTROSE 2-4 GM/100ML-% IV SOLN
2.0000 g | INTRAVENOUS | Status: AC
  Administered 2024-02-03: 2 g via INTRAVENOUS
  Filled 2024-02-03: qty 100

## 2024-02-03 MED ORDER — SODIUM CHLORIDE 0.9 % IR SOLN
Status: DC | PRN
  Administered 2024-02-03: 1000 mL

## 2024-02-03 MED ORDER — PROPOFOL 10 MG/ML IV BOLUS
INTRAVENOUS | Status: AC
  Filled 2024-02-03: qty 20

## 2024-02-03 MED ORDER — 0.9 % SODIUM CHLORIDE (POUR BTL) OPTIME
TOPICAL | Status: DC | PRN
  Administered 2024-02-03: 1000 mL

## 2024-02-03 MED ORDER — CHLORHEXIDINE GLUCONATE 0.12 % MT SOLN
15.0000 mL | Freq: Once | OROMUCOSAL | Status: AC
  Administered 2024-02-03: 15 mL via OROMUCOSAL
  Filled 2024-02-03: qty 15

## 2024-02-03 MED ORDER — PROPOFOL 10 MG/ML IV BOLUS
INTRAVENOUS | Status: DC | PRN
  Administered 2024-02-03: 150 mg via INTRAVENOUS

## 2024-02-03 MED ORDER — FENTANYL CITRATE (PF) 100 MCG/2ML IJ SOLN
INTRAMUSCULAR | Status: AC
  Filled 2024-02-03: qty 2

## 2024-02-03 MED ORDER — DEXAMETHASONE SOD PHOSPHATE PF 10 MG/ML IJ SOLN
INTRAMUSCULAR | Status: DC | PRN
  Administered 2024-02-03: 10 mg via INTRAVENOUS

## 2024-02-03 MED ORDER — LACTATED RINGERS IV SOLN: INTRAVENOUS | Status: DC

## 2024-02-03 MED ORDER — SUGAMMADEX SODIUM 200 MG/2ML IV SOLN
INTRAVENOUS | Status: DC | PRN
  Administered 2024-02-03: 200 mg via INTRAVENOUS

## 2024-02-03 MED ORDER — FENTANYL CITRATE (PF) 250 MCG/5ML IJ SOLN
INTRAMUSCULAR | Status: DC | PRN
  Administered 2024-02-03 (×2): 50 ug via INTRAVENOUS

## 2024-02-03 MED ORDER — LIDOCAINE-EPINEPHRINE 2 %-1:100000 IJ SOLN
INTRAMUSCULAR | Status: AC
  Filled 2024-02-03: qty 1

## 2024-02-03 MED ORDER — LIDOCAINE 2% (20 MG/ML) 5 ML SYRINGE
INTRAMUSCULAR | Status: DC | PRN
  Administered 2024-02-03: 60 mg via INTRAVENOUS

## 2024-02-03 MED ORDER — ACETAMINOPHEN 500 MG PO TABS
1000.0000 mg | ORAL_TABLET | Freq: Once | ORAL | Status: AC
  Administered 2024-02-03: 1000 mg via ORAL
  Filled 2024-02-03: qty 2

## 2024-02-03 MED ORDER — ORAL CARE MOUTH RINSE: 15.0000 mL | Freq: Once | OROMUCOSAL | Status: AC

## 2024-02-03 MED ORDER — LACTATED RINGERS IV SOLN: INTRAVENOUS | Status: DC | PRN

## 2024-02-03 MED ORDER — LIDOCAINE-EPINEPHRINE 1 %-1:100000 IJ SOLN
INTRAMUSCULAR | Status: DC | PRN
  Administered 2024-02-03: 20 mL

## 2024-02-03 MED ORDER — ONDANSETRON HCL 4 MG/2ML IJ SOLN
INTRAMUSCULAR | Status: DC | PRN
  Administered 2024-02-03: 4 mg via INTRAVENOUS

## 2024-02-03 MED ORDER — ROCURONIUM BROMIDE 10 MG/ML (PF) SYRINGE
PREFILLED_SYRINGE | INTRAVENOUS | Status: DC | PRN
  Administered 2024-02-03: 50 mg via INTRAVENOUS

## 2024-02-03 MED ORDER — HYDROCODONE-ACETAMINOPHEN 5-325 MG PO TABS: 1.0000 | ORAL_TABLET | ORAL | 0 refills | Status: AC | PRN

## 2024-02-03 NOTE — Transfer of Care (Signed)
 Immediate Anesthesia Transfer of Care Note  Patient: Jane Graves  Procedure(s) Performed: EXTRACTION TEETH NUMBER SEVEN, EIGHT, NINE, TEN, TWENTY EIGHT, REMOVAL BILATERAL MANDIBULAR LINGUAL TORI (Mouth)  Patient Location: PACU  Anesthesia Type:General  Level of Consciousness: awake, alert , oriented, and patient cooperative  Airway & Oxygen Therapy: Patient Spontanous Breathing and Patient connected to face mask oxygen  Post-op Assessment: Report given to RN, Post -op Vital signs reviewed and stable, Patient moving all extremities, and Patient moving all extremities X 4  Post vital signs: Reviewed and stable  Last Vitals:  Vitals Value Taken Time  BP 140/70 02/03/24 08:27  Temp 36.1 C 02/03/24 08:23  Pulse 96 02/03/24 08:29  Resp 21 02/03/24 08:29  SpO2 95 % 02/03/24 08:29  Vitals shown include unfiled device data.  Last Pain:  Vitals:   02/03/24 0823  PainSc: 0-No pain         Complications: No notable events documented.

## 2024-02-03 NOTE — H&P (Signed)
 H&P documentation  -History and Physical Reviewed  -Patient has been re-examined  -No change in the plan of care  Jane Graves

## 2024-02-03 NOTE — Op Note (Signed)
 02/03/2024  8:12 AM  PATIENT:  Reena Dickens  74 y.o. female  PRE-OPERATIVE DIAGNOSIS:  NON-RESTORABLE TEETH NUMBER SEVEN, EIGHT, NINE, TEN, TWENTY EIGHT SECONDARY TO DENTAL CARIES; BILATERAL MANDIBULAR LINGUAL TORI   POST-OPERATIVE DIAGNOSIS:  SAME  PROCEDURE:  Procedures: EXTRACTION TEETH NUMBER SEVEN, EIGHT, NINE, TEN, TWENTY EIGHT, REMOVAL BILATERAL MANDIBULAR LINGUAL TORI  SURGEON:  Surgeon(s): Sheryle Hamilton, DMD  ANESTHESIA:   local and general  EBL:  minimal  DRAINS: none   SPECIMEN:  No Specimen  COUNTS:  YES  PLAN OF CARE: Discharge to home after PACU  PATIENT DISPOSITION:  PACU - hemodynamically stable.   PROCEDURE DETAILS: Dictation #[id number]  Hamilton EMERSON Sheryle, DMD 02/03/2024 8:12 AM

## 2024-02-03 NOTE — Anesthesia Procedure Notes (Signed)
 Procedure Name: Intubation Date/Time: 02/03/2024 7:29 AM  Performed by: Arvell Edsel HERO, CRNAPre-anesthesia Checklist: Patient identified, Emergency Drugs available, Suction available, Patient being monitored and Timeout performed Patient Re-evaluated:Patient Re-evaluated prior to induction Oxygen Delivery Method: Circle system utilized Preoxygenation: Pre-oxygenation with 100% oxygen Induction Type: IV induction Ventilation: Mask ventilation without difficulty Laryngoscope Size: Glidescope and 3 Grade View: Grade I Nasal Tubes: Left, Nasal Rae and Nasal prep performed Tube size: 6.5 mm Number of attempts: 1 Airway Equipment and Method: Patient positioned with wedge pillow and Video-laryngoscopy Placement Confirmation: ETT inserted through vocal cords under direct vision, positive ETCO2 and breath sounds checked- equal and bilateral Tube secured with: Tape Dental Injury: Teeth and Oropharynx as per pre-operative assessment

## 2024-02-04 ENCOUNTER — Encounter (HOSPITAL_COMMUNITY): Payer: Self-pay | Admitting: Oral Surgery

## 2024-02-06 ENCOUNTER — Encounter: Payer: Self-pay | Admitting: Gastroenterology

## 2024-02-06 NOTE — Op Note (Unsigned)
 NAMEHARMONEE, TOZER MEDICAL RECORD NO: [id number] ACCOUNT NO: [id number] DATE OF BIRTH: 10-10-1950 FACILITY: [facility] LOCATION: [facility] PHYSICIAN: [name], DDS  Operative Report   DATE OF PROCEDURE: 02/03/2024  PREOPERATIVE DIAGNOSES:   1.  Non-restorable teeth #7, #8, #9, #10, #28 secondary to dental caries. 2.  Bilateral mandibular lingual tori.  POSTOPERATIVE DIAGNOSES:   1.  Non-restorable teeth #7, #8, #9, #10, #28 secondary to dental caries. 2.  Bilateral mandibular lingual tori.  PROCEDURE:   1.  Extraction of teeth #7, #8, #9, #10, #28. 2.  Removal of bilateral mandibular lingual tori.  SURGEON:  [name], DDS  ANESTHESIA:  Dr. Niels, attending.  DESCRIPTION OF PROCEDURE:  The patient was taken to the operating room and placed on the table in the supine position.  General anesthesia was administered.  A nasal endotracheal tube was placed and secured.  The eyes were protected, and the patient was  draped for surgery.  Timeout was performed.  The posterior pharynx was suctioned, and a throat pack was placed.  2% lidocaine  1:100,000 epinephrine  was infiltrated in a mandibular alveolar nerve block on the right and left sides and in buccal and palatal  infiltration in the maxilla around the teeth to be removed.  The left side was operated first.  A #15 blade was used to make an incision beginning at tooth #18 in the lingual sulcus and carried forward in the lingual sulcus around teeth #21, #22, #23,  #24, and #25.  The periosteum was reflected to expose the lingual torus.  Then the egg burr was used to reduce the lingual torus by recontouring.  The area was smoothed with a bone file and then irrigated and closed with 3-0 chromic.  Then the maxillary  teeth were removed using a #15 blade to make a sulcular incision around teeth #7, #8, #9, #10 buccally and palatally.  The periosteum was reflected.  The teeth were elevated with the #301 elevator and removed with the  dental forceps.  The sockets were  curetted, irrigated, and closed with 3-0 chromic.  Attention was then turned to the right side.  The #15 blade was used to make an incision in the lingual sulcus from tooth #26 to tooth #31.  The periosteum was reflected.  Tooth #28 was then extracted  using the periosteal elevator to release the periosteum in the buccal aspect of the tooth.  The tooth was elevated, and then the crown fractured upon attempted removal with the dental forceps.  Then the [employer]ryker handpiece was used to remove bone between  teeth #27, #26, and #28, between #27 and #26, and #27 and #28 until the crown could be loosened and removed with a rongeur.  Then the lingual torus was reduced by recontouring using an egg burr followed by the bone file and then irrigated and closed with  3-0 chromic.  The oral cavity was then irrigated and suctioned.  The throat pack was removed.  The patient was left in the care of anesthesia for extubation with plans for discharge home through day surgery.  ESTIMATED BLOOD LOSS:  Minimal.  COMPLICATIONS:  None.  SPECIMENS:  None.  COUNTS:  Correct.     D: 02/03/2024 8:18:03 am T: 02/03/2024 8:20:00 am  JOB: [id number]/ [id number]

## 2024-02-06 NOTE — Anesthesia Postprocedure Evaluation (Signed)
"   Anesthesia Post Note  Patient: Jane Graves  Procedure(s) Performed: EXTRACTION TEETH NUMBER SEVEN, EIGHT, NINE, TEN, TWENTY EIGHT, REMOVAL BILATERAL MANDIBULAR LINGUAL TORI (Mouth)     Patient location during evaluation: PACU Anesthesia Type: General Level of consciousness: awake and alert Pain management: pain level controlled Vital Signs Assessment: post-procedure vital signs reviewed and stable Respiratory status: spontaneous breathing, nonlabored ventilation, respiratory function stable and patient connected to nasal cannula oxygen Cardiovascular status: blood pressure returned to baseline and stable Postop Assessment: no apparent nausea or vomiting Anesthetic complications: no   No notable events documented.  Last Vitals:  Vitals:   02/03/24 0845 02/03/24 0853  BP: 139/70 129/81  Pulse: 85 75  Resp: 16 12  Temp:  (!) 36.3 C  SpO2: 93% 95%    Last Pain:  Vitals:   02/03/24 0823  PainSc: 0-No pain                 Maniah Nading L Edwinna Rochette      "

## 2024-02-07 ENCOUNTER — Ambulatory Visit: Admitting: Family Medicine

## 2024-02-09 ENCOUNTER — Telehealth: Payer: Self-pay

## 2024-02-09 NOTE — Telephone Encounter (Signed)
 I saw in pt's office visit that Jane Graves noted that pt was having oral surgery coming up and that pt would let us  know but I don't see any note of pt letting us  know, pt had her teeth removed 02/03/24.  pt is scheduled 2-9 (Monday) for colon, is this ok? Thanks, Philippe

## 2024-02-13 ENCOUNTER — Encounter: Admitting: Gastroenterology

## 2024-02-17 ENCOUNTER — Ambulatory Visit: Admitting: Family Medicine

## 2024-03-05 ENCOUNTER — Ambulatory Visit: Admitting: Cardiology
